# Patient Record
Sex: Male | Born: 1955 | Race: White | Hispanic: No | Marital: Married | Smoking: Former smoker
Health system: Southern US, Community
[De-identification: ages and names within clinical notes are randomized; demographics above are authoritative.]

## PROBLEM LIST (undated history)

## (undated) DIAGNOSIS — G629 Polyneuropathy, unspecified: Secondary | ICD-10-CM

## (undated) DIAGNOSIS — M545 Low back pain, unspecified: Secondary | ICD-10-CM

## (undated) DIAGNOSIS — Z83719 Family history of colon polyps, unspecified: Secondary | ICD-10-CM

## (undated) DIAGNOSIS — G709 Myoneural disorder, unspecified: Secondary | ICD-10-CM

## (undated) DIAGNOSIS — I10 Essential (primary) hypertension: Secondary | ICD-10-CM

## (undated) DIAGNOSIS — Z860100 Personal history of colon polyps, unspecified: Secondary | ICD-10-CM

## (undated) DIAGNOSIS — E785 Hyperlipidemia, unspecified: Secondary | ICD-10-CM

## (undated) DIAGNOSIS — K219 Gastro-esophageal reflux disease without esophagitis: Secondary | ICD-10-CM

## (undated) DIAGNOSIS — J189 Pneumonia, unspecified organism: Secondary | ICD-10-CM

## (undated) DIAGNOSIS — M51369 Other intervertebral disc degeneration, lumbar region without mention of lumbar back pain or lower extremity pain: Secondary | ICD-10-CM

## (undated) DIAGNOSIS — Z8371 Family history of colonic polyps: Secondary | ICD-10-CM

## (undated) DIAGNOSIS — D649 Anemia, unspecified: Secondary | ICD-10-CM

## (undated) DIAGNOSIS — M5090 Cervical disc disorder, unspecified, unspecified cervical region: Secondary | ICD-10-CM

## (undated) DIAGNOSIS — M5136 Other intervertebral disc degeneration, lumbar region: Secondary | ICD-10-CM

## (undated) DIAGNOSIS — R011 Cardiac murmur, unspecified: Secondary | ICD-10-CM

## (undated) DIAGNOSIS — I639 Cerebral infarction, unspecified: Secondary | ICD-10-CM

## (undated) HISTORY — PX: HEMORRHOID SURGERY: SHX153

## (undated) HISTORY — PX: EYE SURGERY: SHX253

## (undated) HISTORY — PX: ANTERIOR CERVICAL DECOMP/DISCECTOMY FUSION: SHX1161

## (undated) HISTORY — PX: WISDOM TOOTH EXTRACTION: SHX21

## (undated) HISTORY — PX: KNEE ARTHROSCOPY W/ ACL RECONSTRUCTION: SHX1858

## (undated) HISTORY — PX: TONSILLECTOMY: SUR1361

## (undated) HISTORY — PX: OTHER SURGICAL HISTORY: SHX169

## (undated) HISTORY — PX: COLONOSCOPY W/ POLYPECTOMY: SHX1380

## (undated) HISTORY — PX: BACK SURGERY: SHX140

---
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
## 1998-02-25 ENCOUNTER — Ambulatory Visit (HOSPITAL_COMMUNITY): Admission: RE | Admit: 1998-02-25 | Discharge: 1998-02-25 | Payer: Self-pay | Admitting: Neurosurgery

## 1998-03-22 ENCOUNTER — Ambulatory Visit (HOSPITAL_COMMUNITY): Admission: RE | Admit: 1998-03-22 | Discharge: 1998-03-22 | Payer: Self-pay | Admitting: Neurosurgery

## 1999-03-20 ENCOUNTER — Ambulatory Visit (HOSPITAL_COMMUNITY): Admission: RE | Admit: 1999-03-20 | Discharge: 1999-03-20 | Payer: Self-pay | Admitting: Orthopaedic Surgery

## 1999-03-20 ENCOUNTER — Encounter: Payer: Self-pay | Admitting: Orthopaedic Surgery

## 1999-04-12 ENCOUNTER — Other Ambulatory Visit: Admission: RE | Admit: 1999-04-12 | Discharge: 1999-04-12 | Payer: Self-pay | Admitting: Orthopaedic Surgery

## 2000-06-02 ENCOUNTER — Ambulatory Visit (HOSPITAL_COMMUNITY): Admission: RE | Admit: 2000-06-02 | Discharge: 2000-06-02 | Payer: Self-pay | Admitting: Neurosurgery

## 2000-06-02 ENCOUNTER — Encounter: Payer: Self-pay | Admitting: Neurosurgery

## 2000-06-12 ENCOUNTER — Ambulatory Visit (HOSPITAL_COMMUNITY): Admission: RE | Admit: 2000-06-12 | Discharge: 2000-06-12 | Payer: Self-pay | Admitting: Neurosurgery

## 2000-06-12 ENCOUNTER — Encounter: Payer: Self-pay | Admitting: Neurosurgery

## 2001-07-16 ENCOUNTER — Ambulatory Visit (HOSPITAL_COMMUNITY): Admission: RE | Admit: 2001-07-16 | Discharge: 2001-07-16 | Payer: Self-pay | Admitting: Neurosurgery

## 2001-07-16 ENCOUNTER — Encounter: Payer: Self-pay | Admitting: Neurosurgery

## 2001-08-24 ENCOUNTER — Encounter: Admission: RE | Admit: 2001-08-24 | Discharge: 2001-08-24 | Payer: Self-pay | Admitting: Neurosurgery

## 2001-08-24 ENCOUNTER — Encounter: Payer: Self-pay | Admitting: Neurosurgery

## 2001-09-07 ENCOUNTER — Encounter: Admission: RE | Admit: 2001-09-07 | Discharge: 2001-09-07 | Payer: Self-pay | Admitting: Neurosurgery

## 2001-09-07 ENCOUNTER — Encounter: Payer: Self-pay | Admitting: Neurosurgery

## 2001-09-21 ENCOUNTER — Encounter: Payer: Self-pay | Admitting: Neurosurgery

## 2001-09-21 ENCOUNTER — Encounter: Admission: RE | Admit: 2001-09-21 | Discharge: 2001-09-21 | Payer: Self-pay | Admitting: Neurosurgery

## 2002-07-25 ENCOUNTER — Ambulatory Visit (HOSPITAL_COMMUNITY): Admission: RE | Admit: 2002-07-25 | Discharge: 2002-07-25 | Payer: Self-pay | Admitting: Orthopaedic Surgery

## 2002-07-25 ENCOUNTER — Encounter: Payer: Self-pay | Admitting: Orthopaedic Surgery

## 2002-11-11 HISTORY — PX: EYE SURGERY: SHX253

## 2003-08-14 ENCOUNTER — Encounter: Payer: Self-pay | Admitting: Orthopaedic Surgery

## 2003-08-14 ENCOUNTER — Ambulatory Visit (HOSPITAL_COMMUNITY): Admission: RE | Admit: 2003-08-14 | Discharge: 2003-08-14 | Payer: Self-pay | Admitting: Orthopaedic Surgery

## 2004-11-08 ENCOUNTER — Ambulatory Visit (HOSPITAL_COMMUNITY): Admission: RE | Admit: 2004-11-08 | Discharge: 2004-11-08 | Payer: Self-pay | Admitting: Neurosurgery

## 2005-07-10 ENCOUNTER — Encounter: Admission: RE | Admit: 2005-07-10 | Discharge: 2005-07-10 | Payer: Self-pay | Admitting: Neurosurgery

## 2007-02-19 ENCOUNTER — Ambulatory Visit (HOSPITAL_COMMUNITY): Admission: RE | Admit: 2007-02-19 | Discharge: 2007-02-19 | Payer: Self-pay | Admitting: Neurosurgery

## 2007-10-01 ENCOUNTER — Ambulatory Visit: Payer: Self-pay | Admitting: Family Medicine

## 2008-04-10 IMAGING — CT CT PARANASAL SINUSES W/O CM
1 of 2 series · 16 of 30 positions shown, 20 images · non-contrast
Comparison: none

REASON FOR EXAM: chronic sinusitis
COMMENTS:

[Series 2: sinus · axial · 0.29mm/px · z∈[+223,+303]mm · 16 of 33 slices shown, 20 images]
[im 2/33  brain]
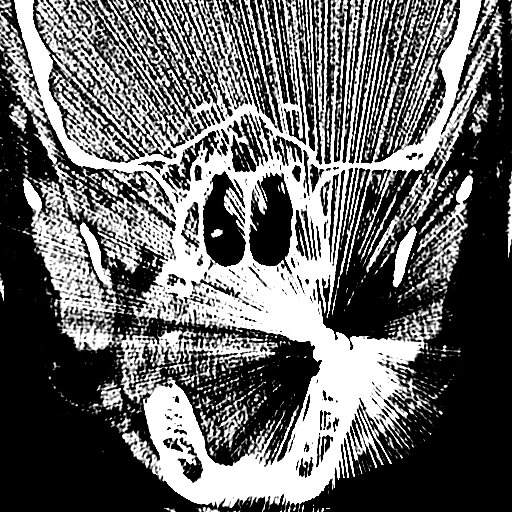
[im 2/33  bone]
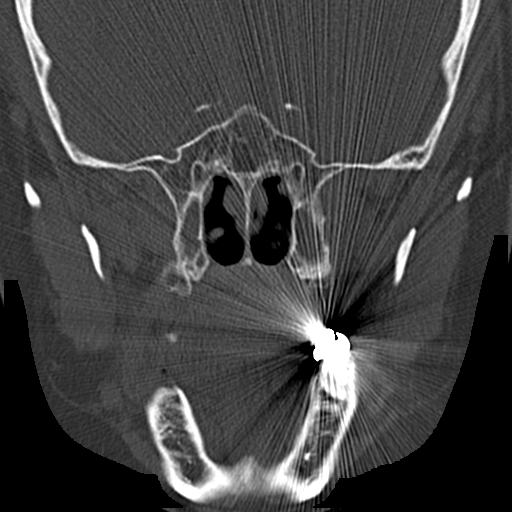
[im 5/33  bone]
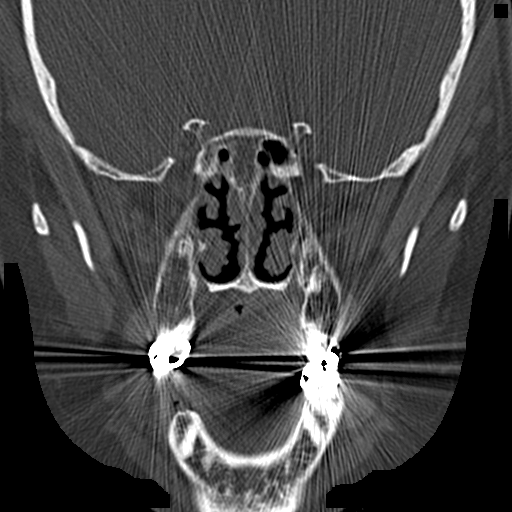
[im 6/33  bone]
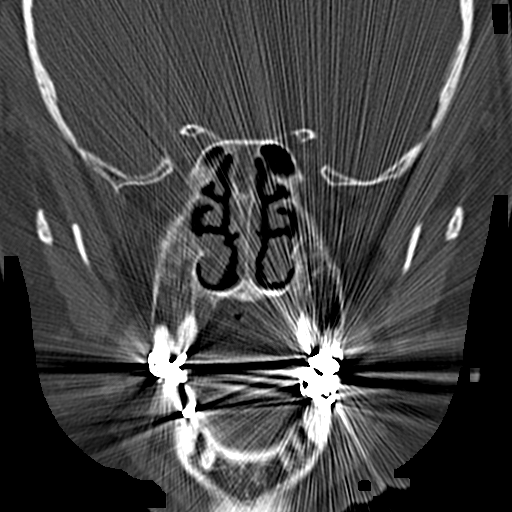
[im 9/33  bone]
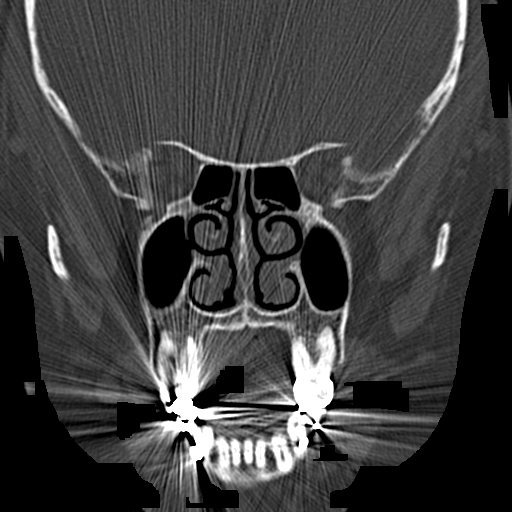
[im 10/33  brain]
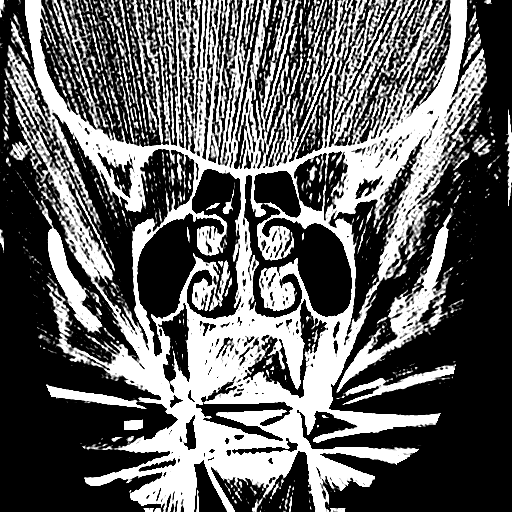
[im 10/33  bone]
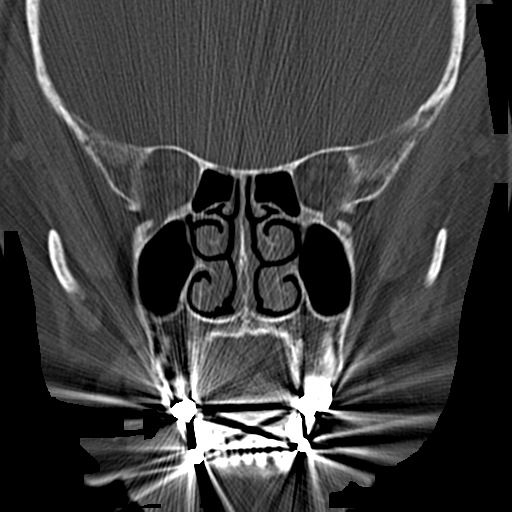
[im 11/33  bone]
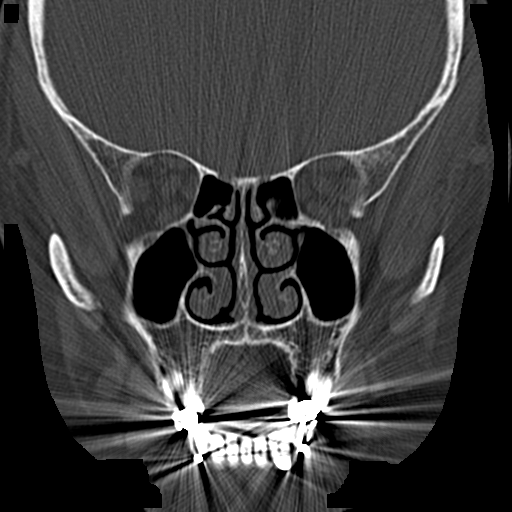
[im 14/33  bone]
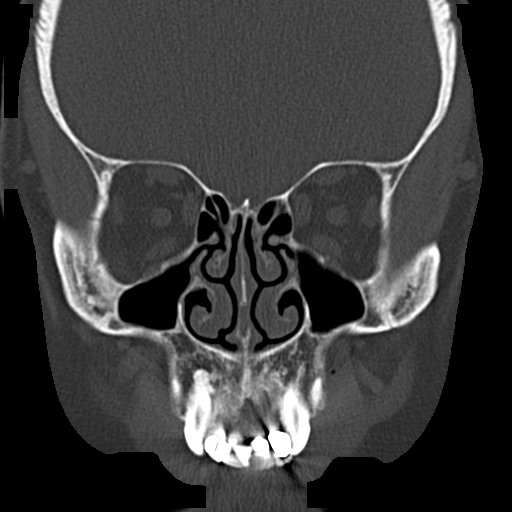
[im 15/33  bone]
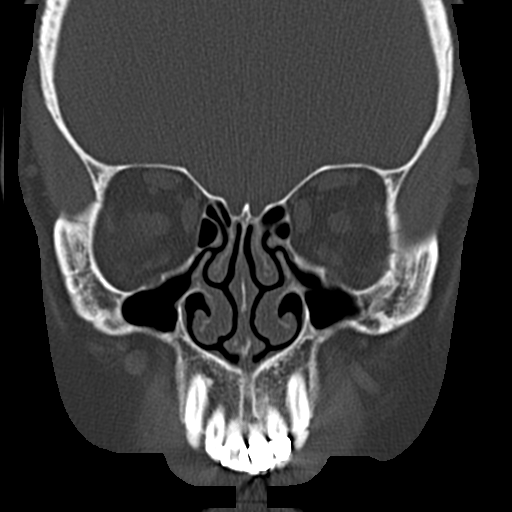
[im 18/33  brain]
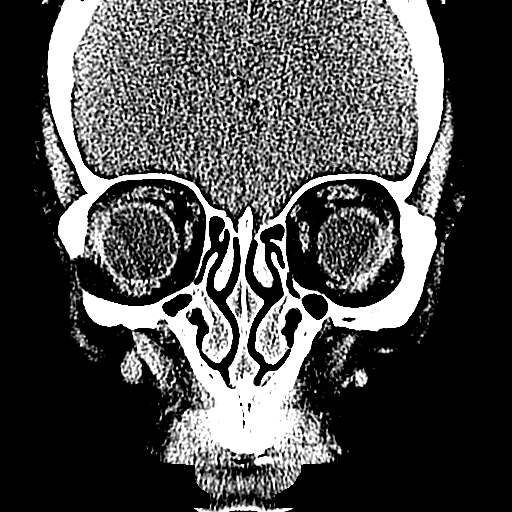
[im 18/33  bone]
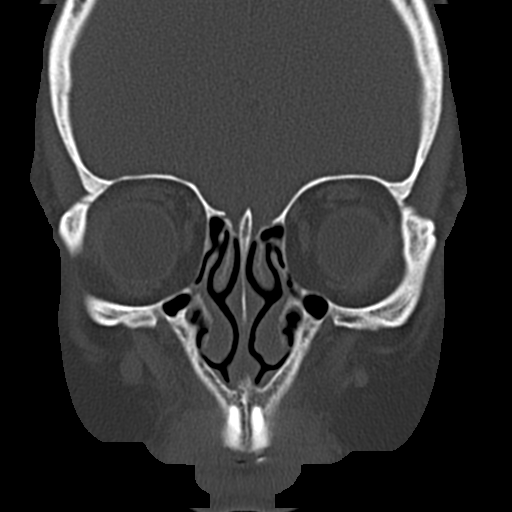
[im 19/33  bone]
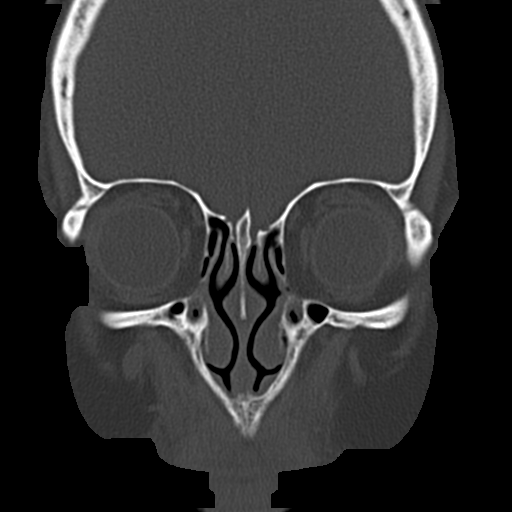
[im 22/33  bone]
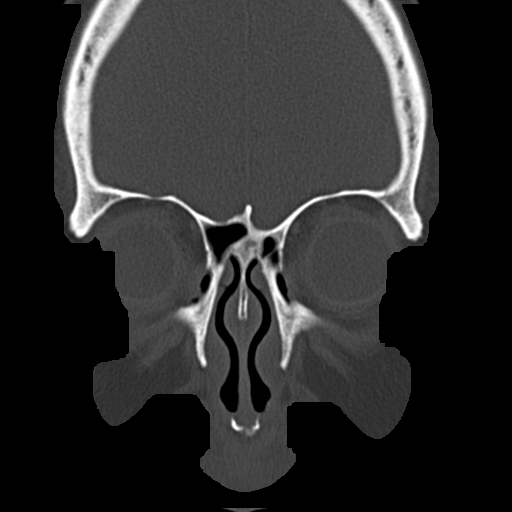
[im 23/33  bone]
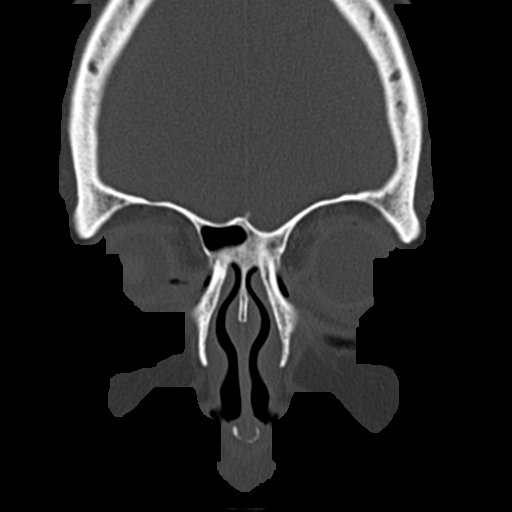
[im 25/33  brain]
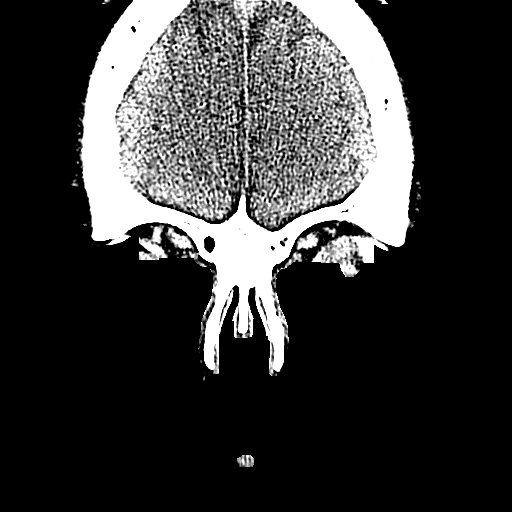
[im 25/33  bone]
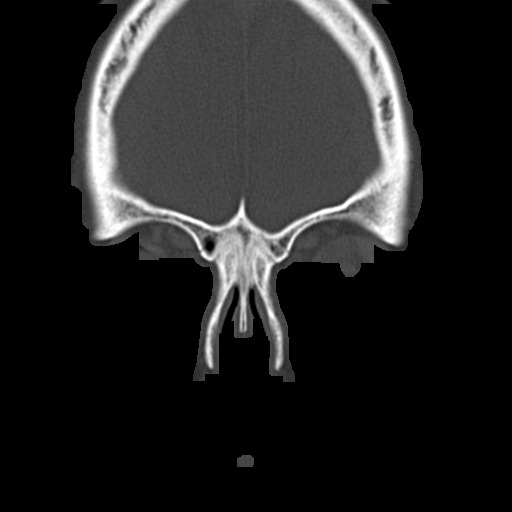
[im 27/33  bone]
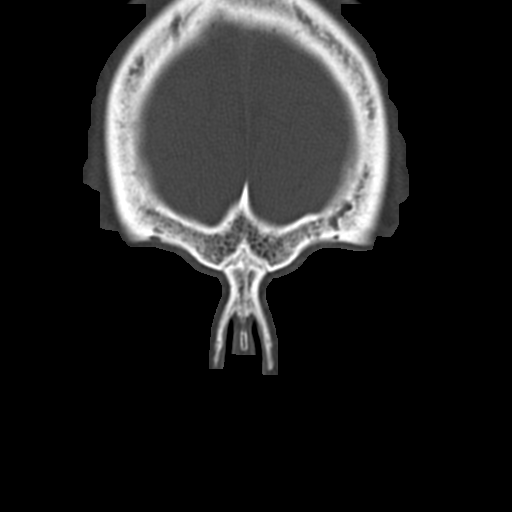
[im 29/33  bone]
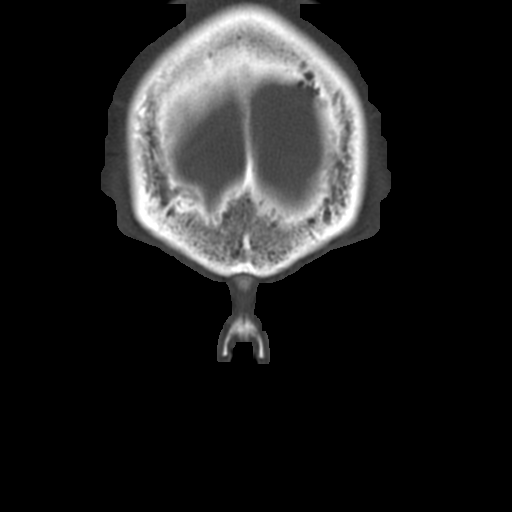
[im 31/33  bone]
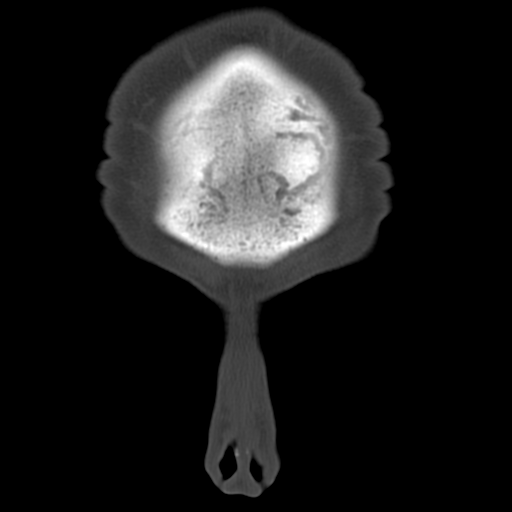

[16 of 30 positions shown; findings below may reference images not displayed]

PROCEDURE:     AALTJE - AALTJE SINUSES WITHOUT CONTRAST  - October 01, 2007  [DATE]

RESULT:     Direct coronal CT of the paranasal sinuses shows the frontal
sinuses are aplastic. The nasal septum approximates midline. The sinuses
otherwise appear to be normally aerated with no air-fluid level or mucosal
thickening evident. There is no bony destruction demonstrated.
IMPRESSION: No findings to suggest acute sinusitis. No definite bony
thickening or destruction to suggest chronic sinusitis. The ostiomeatal
complexes are patent and normally formed. The nasal septum approximates
midline.

## 2008-12-04 ENCOUNTER — Ambulatory Visit: Payer: Self-pay | Admitting: Family Medicine

## 2008-12-10 ENCOUNTER — Observation Stay: Payer: Self-pay | Admitting: Internal Medicine

## 2009-01-04 ENCOUNTER — Ambulatory Visit: Payer: Self-pay | Admitting: Neurology

## 2009-02-15 ENCOUNTER — Encounter: Admission: RE | Admit: 2009-02-15 | Discharge: 2009-02-15 | Payer: Self-pay | Admitting: Neurosurgery

## 2011-07-16 ENCOUNTER — Other Ambulatory Visit: Payer: Self-pay | Admitting: Neurosurgery

## 2011-07-16 DIAGNOSIS — M47817 Spondylosis without myelopathy or radiculopathy, lumbosacral region: Secondary | ICD-10-CM

## 2011-07-19 ENCOUNTER — Ambulatory Visit
Admission: RE | Admit: 2011-07-19 | Discharge: 2011-07-19 | Disposition: A | Discharge status: Home / Self Care | Payer: BC Managed Care – PPO | Source: Ambulatory Visit | Attending: Neurosurgery | Admitting: Neurosurgery

## 2011-07-19 DIAGNOSIS — M47817 Spondylosis without myelopathy or radiculopathy, lumbosacral region: Secondary | ICD-10-CM

## 2012-08-18 ENCOUNTER — Other Ambulatory Visit: Payer: Self-pay | Admitting: Neurosurgery

## 2012-08-18 DIAGNOSIS — G5711 Meralgia paresthetica, right lower limb: Secondary | ICD-10-CM

## 2013-02-25 ENCOUNTER — Other Ambulatory Visit: Payer: Self-pay | Admitting: Neurosurgery

## 2013-02-25 DIAGNOSIS — M47817 Spondylosis without myelopathy or radiculopathy, lumbosacral region: Secondary | ICD-10-CM

## 2013-03-04 ENCOUNTER — Ambulatory Visit
Admission: RE | Admit: 2013-03-04 | Discharge: 2013-03-04 | Disposition: A | Discharge status: Home / Self Care | Payer: BC Managed Care – PPO | Source: Ambulatory Visit | Attending: Neurosurgery | Admitting: Neurosurgery

## 2013-03-04 DIAGNOSIS — M47817 Spondylosis without myelopathy or radiculopathy, lumbosacral region: Secondary | ICD-10-CM

## 2013-07-09 ENCOUNTER — Ambulatory Visit: Payer: Self-pay

## 2016-04-29 ENCOUNTER — Ambulatory Visit
Admission: RE | Admit: 2016-04-29 | Discharge: 2016-04-29 | Disposition: A | Discharge status: Home / Self Care | Payer: Medicare HMO | Source: Ambulatory Visit | Attending: Unknown Physician Specialty | Admitting: Unknown Physician Specialty

## 2016-04-29 ENCOUNTER — Encounter
Admission: RE | Disposition: A | Discharge status: Home / Self Care | Payer: Self-pay | Source: Ambulatory Visit | Attending: Unknown Physician Specialty

## 2016-04-29 ENCOUNTER — Ambulatory Visit: Payer: Medicare HMO | Admitting: Anesthesiology

## 2016-04-29 ENCOUNTER — Encounter: Payer: Self-pay | Admitting: *Deleted

## 2016-04-29 DIAGNOSIS — M5090 Cervical disc disorder, unspecified, unspecified cervical region: Secondary | ICD-10-CM | POA: Diagnosis not present

## 2016-04-29 DIAGNOSIS — Z79899 Other long term (current) drug therapy: Secondary | ICD-10-CM | POA: Diagnosis not present

## 2016-04-29 DIAGNOSIS — D122 Benign neoplasm of ascending colon: Secondary | ICD-10-CM | POA: Insufficient documentation

## 2016-04-29 DIAGNOSIS — Z8371 Family history of colonic polyps: Secondary | ICD-10-CM | POA: Diagnosis not present

## 2016-04-29 DIAGNOSIS — D123 Benign neoplasm of transverse colon: Secondary | ICD-10-CM | POA: Insufficient documentation

## 2016-04-29 DIAGNOSIS — I10 Essential (primary) hypertension: Secondary | ICD-10-CM | POA: Diagnosis not present

## 2016-04-29 DIAGNOSIS — M5136 Other intervertebral disc degeneration, lumbar region: Secondary | ICD-10-CM | POA: Diagnosis not present

## 2016-04-29 DIAGNOSIS — D125 Benign neoplasm of sigmoid colon: Secondary | ICD-10-CM | POA: Insufficient documentation

## 2016-04-29 DIAGNOSIS — Z8673 Personal history of transient ischemic attack (TIA), and cerebral infarction without residual deficits: Secondary | ICD-10-CM | POA: Diagnosis not present

## 2016-04-29 DIAGNOSIS — K640 First degree hemorrhoids: Secondary | ICD-10-CM | POA: Insufficient documentation

## 2016-04-29 DIAGNOSIS — Z7982 Long term (current) use of aspirin: Secondary | ICD-10-CM | POA: Diagnosis not present

## 2016-04-29 DIAGNOSIS — Z1211 Encounter for screening for malignant neoplasm of colon: Secondary | ICD-10-CM | POA: Diagnosis present

## 2016-04-29 HISTORY — DX: Essential (primary) hypertension: I10

## 2016-04-29 HISTORY — DX: Other intervertebral disc degeneration, lumbar region without mention of lumbar back pain or lower extremity pain: M51.369

## 2016-04-29 HISTORY — DX: Other intervertebral disc degeneration, lumbar region: M51.36

## 2016-04-29 HISTORY — PX: COLONOSCOPY WITH PROPOFOL: SHX5780

## 2016-04-29 HISTORY — DX: Cervical disc disorder, unspecified, unspecified cervical region: M50.90

## 2016-04-29 SURGERY — COLONOSCOPY WITH PROPOFOL
Anesthesia: General

## 2016-04-29 MED ORDER — SODIUM CHLORIDE 0.9 % IV SOLN
INTRAVENOUS | Status: DC
  Administered 2016-04-29: 1000 mL via INTRAVENOUS

## 2016-04-29 MED ORDER — LIDOCAINE HCL (PF) 2 % IJ SOLN
INTRAMUSCULAR | Status: DC | PRN
  Administered 2016-04-29: 50 mg

## 2016-04-29 MED ORDER — MIDAZOLAM HCL 5 MG/5ML IJ SOLN
INTRAMUSCULAR | Status: DC | PRN
  Administered 2016-04-29 (×2): 1 mg via INTRAVENOUS

## 2016-04-29 MED ORDER — PROPOFOL 500 MG/50ML IV EMUL
INTRAVENOUS | Status: DC | PRN
  Administered 2016-04-29: 10 ug/kg/min via INTRAVENOUS

## 2016-04-29 MED ORDER — FENTANYL CITRATE (PF) 100 MCG/2ML IJ SOLN
INTRAMUSCULAR | Status: DC | PRN
  Administered 2016-04-29: 50 ug via INTRAVENOUS

## 2016-04-29 MED ORDER — PROPOFOL 10 MG/ML IV BOLUS
INTRAVENOUS | Status: DC | PRN
  Administered 2016-04-29: 50 mg via INTRAVENOUS

## 2016-04-29 MED ORDER — SODIUM CHLORIDE 0.9 % IV SOLN: INTRAVENOUS | Status: DC
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
## 2016-04-29 NOTE — Op Note (Addendum)
[facility] Gastroenterology Patient Name: Tony Bender Procedure Date: 04/29/2016 9:23 AM MRN: [id number] Account #: [id number] Date of Birth: 30-Nov-1955 Admit Type: Outpatient Age: 60 Room: [facility] ENDO ROOM 1 Gender: Male Note Status: Finalized Procedure:            Colonoscopy Indications:          Colon cancer screening in patient at increased risk:                        Family history of 1st-degree relative with colon polyps Providers:            Manya Silvas, MD Referring MD:         Sena Hitch, MD (Referring MD) Medicines:            Propofol per Anesthesia Complications:        No immediate complications. Procedure:            Pre-Anesthesia Assessment:                       - After reviewing the risks and benefits, the patient                        was deemed in satisfactory condition to undergo the                        procedure.                       After obtaining informed consent, the colonoscope was                        passed under direct vision. Throughout the procedure,                        the patient's blood pressure, pulse, and oxygen                        saturations were monitored continuously. The                        Colonoscope was introduced through the anus and                        advanced to the the cecum, identified by appendiceal                        orifice and ileocecal valve. The colonoscopy was                        performed without difficulty. The patient tolerated the                        procedure well. The quality of the bowel preparation                        was excellent. Findings:      Two sessile polyps were found in the ascending colon. The polyps were       diminutive in size. These polyps were removed with a jumbo cold forceps.       Resection and retrieval were complete. Bottle #2  Two sessile polyps were found in the ascending colon. The polyps were       small in size. These  polyps were removed with a cold snare. Resection       and retrieval were complete. Bottle 3      A small polyp was found in the proximal sigmoid colon. The polyp was       sessile. The polyp was removed with a hot snare. Resection and retrieval       were complete. bottle #4      Internal hemorrhoids were found during endoscopy. The hemorrhoids were       medium-sized and Grade I (internal hemorrhoids that do not prolapse).      A diminutive polyp was found in the hepatic flexure. The polyp was       sessile. The polyp was removed with a jumbo cold forceps. Resection and       retrieval were complete. Label bottle # 1 Impression:           - Two diminutive polyps in the ascending colon, removed                        with a jumbo cold forceps. Resected and retrieved.                       - Two small polyps in the ascending colon, removed with                        a cold snare. Resected and retrieved.                       - One small polyp in the proximal sigmoid colon,                        removed with a hot snare. Resected and retrieved.                       - Internal hemorrhoids.                       - One diminutive polyp at the hepatic flexure, removed                        with a jumbo cold forceps. Resected and retrieved. Recommendation:       - Await pathology results. Manya Silvas, MD 04/29/2016 10:12:14 AM This report has been signed electronically. Number of Addenda: 0 Note Initiated On: 04/29/2016 9:23 AM Scope Withdrawal Time: 0 hours 11 minutes 21 seconds  Total Procedure Duration: 0 hours 25 minutes 0 seconds       [facility]

## 2016-04-29 NOTE — Anesthesia Postprocedure Evaluation (Signed)
Anesthesia Post Note  Patient: Tony Bender  Procedure(s) Performed: Procedure(s) (LRB): COLONOSCOPY WITH PROPOFOL (N/A)  Patient location during evaluation: Endoscopy Anesthesia Type: General Level of consciousness: awake and alert Pain management: pain level controlled Vital Signs Assessment: post-procedure vital signs reviewed and stable Respiratory status: spontaneous breathing, nonlabored ventilation, respiratory function stable and patient connected to nasal cannula oxygen Cardiovascular status: blood pressure returned to baseline and stable Postop Assessment: no signs of nausea or vomiting Anesthetic complications: no    Last Vitals:  Filed Vitals:   04/29/16 1030 04/29/16 1040  BP: 111/61 113/65  Pulse: 64 62  Temp:    Resp: 13 16    Last Pain:  Filed Vitals:   04/29/16 1048  PainSc: 3                  Areona Homer S

## 2016-04-29 NOTE — H&P (Signed)
   Primary Care Physician:  No primary care provider on file. Primary Gastroenterologist:  Dr. Vira Agar  Pre-Procedure History & Physical: HPI:  Tony Bender is a 60 y.o. male is here for an colonoscopy.   Past Medical History  Diagnosis Date  . Hypertension   . Disc degeneration, lumbar   . Disc disorder of cervical region     Past Surgical History  Procedure Laterality Date  . Knee arthroscopy w/ acl reconstruction      Prior to Admission medications   Medication Sig Start Date End Date Taking? Authorizing Provider  aspirin EC 81 MG tablet Take 81 mg by mouth daily.   Yes Historical Provider, MD  Cholecalciferol (VITAMIN D3) 3000 units TABS Take by mouth.   Yes Historical Provider, MD  cyclobenzaprine (FLEXERIL) 10 MG tablet Take 10 mg by mouth 3 (three) times daily as needed for muscle spasms.   Yes Historical Provider, MD  HYDROcodone-acetaminophen (NORCO) 10-325 MG tablet Take 1 tablet by mouth every 6 (six) hours as needed.   Yes Historical Provider, MD  lisinopril-hydrochlorothiazide (PRINZIDE,ZESTORETIC) 20-12.5 MG tablet Take 1 tablet by mouth daily.   Yes Historical Provider, MD  Omega-3 Fatty Acids (FISH OIL OMEGA-3 PO) Take by mouth.   Yes Historical Provider, MD  rosuvastatin (CRESTOR) 5 MG tablet Take 5 mg by mouth daily.   Yes Historical Provider, MD    Allergies as of 04/11/2016  . (Not on File)    No family history on file.  Social History   Social History  . Marital Status: Married    Spouse Name: N/A  . Number of Children: N/A  . Years of Education: N/A   Occupational History  . Not on file.   Social History Main Topics  . Smoking status: Not on file  . Smokeless tobacco: Not on file  . Alcohol Use: Not on file  . Drug Use: Not on file  . Sexual Activity: Not on file   Other Topics Concern  . Not on file   Social History Narrative  . No narrative on file    Review of Systems: See HPI, otherwise negative ROS  Physical Exam: BP  149/90 mmHg  Pulse 75  Temp(Src) 96.8 F (36 C) (Tympanic)  Resp 16  Ht 6\' 3"  (1.905 m)  Wt 106.595 kg (235 lb)  BMI 29.37 kg/m2  SpO2 100% General:   Alert,  pleasant and cooperative in NAD Head:  Normocephalic and atraumatic. Neck:  Supple; no masses or thyromegaly. Lungs:  Clear throughout to auscultation.    Heart:  Regular rate and rhythm. Abdomen:  Soft, nontender and nondistended. Normal bowel sounds, without guarding, and without rebound.   Neurologic:  Alert and  oriented x4;  grossly normal neurologically.  Impression/Plan: Tony Bender is here for an colonoscopy to be performed for screening with FH colon polyps  Risks, benefits, limitations, and alternatives regarding  colonoscopy have been reviewed with the patient.  Questions have been answered.  All parties agreeable.   Gaylyn Cheers, MD  04/29/2016, 9:34 AM

## 2016-04-29 NOTE — Transfer of Care (Signed)
Immediate Anesthesia Transfer of Care Note  Patient: Tony Bender  Procedure(s) Performed: Procedure(s): COLONOSCOPY WITH PROPOFOL (N/A)  Patient Location: PACU  Anesthesia Type:General  Level of Consciousness: sedated  Airway & Oxygen Therapy: Patient Spontanous Breathing and Patient connected to nasal cannula oxygen  Post-op Assessment: Report given to RN and Post -op Vital signs reviewed and stable  Post vital signs: Reviewed and stable  Last Vitals:  Filed Vitals:   04/29/16 0851 04/29/16 1014  BP: 149/90 96/61  Pulse: 75 72  Temp: 36 C 35.6 C  Resp: 16 12    Last Pain:  Filed Vitals:   04/29/16 1014  PainSc: 3          Complications: No apparent anesthesia complications

## 2016-04-30 ENCOUNTER — Encounter: Payer: Self-pay | Admitting: Unknown Physician Specialty

## 2016-04-30 LAB — SURGICAL PATHOLOGY

## 2016-06-06 ENCOUNTER — Other Ambulatory Visit: Payer: Self-pay | Admitting: Neurosurgery

## 2016-06-06 DIAGNOSIS — M47812 Spondylosis without myelopathy or radiculopathy, cervical region: Secondary | ICD-10-CM

## 2016-06-19 ENCOUNTER — Ambulatory Visit
Admission: RE | Admit: 2016-06-19 | Discharge: 2016-06-19 | Disposition: A | Discharge status: Home / Self Care | Payer: Medicare HMO | Source: Ambulatory Visit | Attending: Neurosurgery | Admitting: Neurosurgery

## 2016-06-19 DIAGNOSIS — M47812 Spondylosis without myelopathy or radiculopathy, cervical region: Secondary | ICD-10-CM

## 2016-07-13 ENCOUNTER — Encounter: Payer: Self-pay | Admitting: Gynecology

## 2016-07-13 ENCOUNTER — Ambulatory Visit (INDEPENDENT_AMBULATORY_CARE_PROVIDER_SITE_OTHER): Payer: Medicare HMO

## 2016-07-13 ENCOUNTER — Ambulatory Visit
Admission: EM | Admit: 2016-07-13 | Discharge: 2016-07-13 | Disposition: A | Discharge status: Home / Self Care | Payer: Medicare HMO | Attending: Family Medicine | Admitting: Family Medicine

## 2016-07-13 DIAGNOSIS — S29009A Unspecified injury of muscle and tendon of unspecified wall of thorax, initial encounter: Secondary | ICD-10-CM | POA: Diagnosis not present

## 2016-07-13 DIAGNOSIS — S161XXA Strain of muscle, fascia and tendon at neck level, initial encounter: Secondary | ICD-10-CM

## 2016-07-13 DIAGNOSIS — S29019A Strain of muscle and tendon of unspecified wall of thorax, initial encounter: Secondary | ICD-10-CM
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
## 2018-06-18 ENCOUNTER — Other Ambulatory Visit: Payer: Self-pay | Admitting: Neurosurgery

## 2018-06-18 DIAGNOSIS — M47816 Spondylosis without myelopathy or radiculopathy, lumbar region: Secondary | ICD-10-CM

## 2018-06-21 ENCOUNTER — Ambulatory Visit
Admission: RE | Admit: 2018-06-21 | Discharge: 2018-06-21 | Disposition: A | Discharge status: Home / Self Care | Payer: Medicare HMO | Source: Ambulatory Visit | Attending: Neurosurgery | Admitting: Neurosurgery

## 2018-06-21 DIAGNOSIS — M47816 Spondylosis without myelopathy or radiculopathy, lumbar region: Secondary | ICD-10-CM

## 2019-09-01 ENCOUNTER — Telehealth: Payer: Self-pay | Admitting: Gastroenterology

## 2019-09-01 NOTE — Telephone Encounter (Signed)
Pt is calling for Michelle to schedule a colonoscopy °

## 2019-09-01 NOTE — Telephone Encounter (Signed)
Patients referral has been sent to our office in error.  Patients last colonoscopy was 2017 at [facility] history of colon polyps.  He was seen by Tammi Klippel at [facility] in May and they planned to schedule his repeat colonoscopy-but he never heard back from them.  He said he mentioned this to Dr. Shade Flood and guess Dr. Shade Flood sent [place] the referral.  He has been provided with [facility] phone number to contact them when he is ready to schedule.  Thanks [employer]

## 2019-12-23 ENCOUNTER — Ambulatory Visit: Admit: 2019-12-23 | Payer: Medicare HMO | Admitting: Internal Medicine

## 2019-12-23 SURGERY — COLONOSCOPY WITH PROPOFOL
Anesthesia: General

## 2020-01-13 ENCOUNTER — Other Ambulatory Visit
Admission: RE | Admit: 2020-01-13 | Discharge: 2020-01-13 | Disposition: A | Discharge status: Home / Self Care | Payer: Medicare HMO | Source: Ambulatory Visit | Attending: Internal Medicine | Admitting: Internal Medicine

## 2020-01-13 DIAGNOSIS — Z20822 Contact with and (suspected) exposure to covid-19: Secondary | ICD-10-CM | POA: Insufficient documentation

## 2020-01-13 DIAGNOSIS — Z01812 Encounter for preprocedural laboratory examination: Secondary | ICD-10-CM | POA: Diagnosis present

## 2020-01-13 LAB — SARS CORONAVIRUS 2 (TAT 6-24 HRS): SARS Coronavirus 2: NEGATIVE

## 2020-01-14 ENCOUNTER — Encounter: Payer: Self-pay | Admitting: Internal Medicine

## 2020-01-17 ENCOUNTER — Ambulatory Visit
Admission: RE | Admit: 2020-01-17 | Discharge: 2020-01-17 | Disposition: A | Discharge status: Home / Self Care | Payer: Medicare HMO | Attending: Internal Medicine | Admitting: Internal Medicine

## 2020-01-17 ENCOUNTER — Ambulatory Visit: Payer: Medicare HMO | Admitting: Anesthesiology

## 2020-01-17 ENCOUNTER — Encounter
Admission: RE | Disposition: A | Discharge status: Home / Self Care | Payer: Self-pay | Source: Home / Self Care | Attending: Internal Medicine

## 2020-01-17 ENCOUNTER — Other Ambulatory Visit: Payer: Self-pay

## 2020-01-17 ENCOUNTER — Encounter: Payer: Self-pay | Admitting: Internal Medicine

## 2020-01-17 DIAGNOSIS — M1990 Unspecified osteoarthritis, unspecified site: Secondary | ICD-10-CM | POA: Diagnosis not present

## 2020-01-17 DIAGNOSIS — Z79899 Other long term (current) drug therapy: Secondary | ICD-10-CM | POA: Insufficient documentation

## 2020-01-17 DIAGNOSIS — Z888 Allergy status to other drugs, medicaments and biological substances status: Secondary | ICD-10-CM | POA: Diagnosis not present

## 2020-01-17 DIAGNOSIS — Z86010 Personal history of colonic polyps: Secondary | ICD-10-CM | POA: Insufficient documentation

## 2020-01-17 DIAGNOSIS — Z7982 Long term (current) use of aspirin: Secondary | ICD-10-CM | POA: Insufficient documentation

## 2020-01-17 DIAGNOSIS — K640 First degree hemorrhoids: Secondary | ICD-10-CM | POA: Diagnosis not present

## 2020-01-17 DIAGNOSIS — K219 Gastro-esophageal reflux disease without esophagitis: Secondary | ICD-10-CM | POA: Insufficient documentation

## 2020-01-17 DIAGNOSIS — Z1211 Encounter for screening for malignant neoplasm of colon: Secondary | ICD-10-CM | POA: Insufficient documentation

## 2020-01-17 DIAGNOSIS — Z880 Allergy status to penicillin: Secondary | ICD-10-CM | POA: Diagnosis not present

## 2020-01-17 DIAGNOSIS — Z8673 Personal history of transient ischemic attack (TIA), and cerebral infarction without residual deficits: Secondary | ICD-10-CM | POA: Insufficient documentation

## 2020-01-17 DIAGNOSIS — Z87891 Personal history of nicotine dependence: Secondary | ICD-10-CM | POA: Insufficient documentation

## 2020-01-17 DIAGNOSIS — E785 Hyperlipidemia, unspecified: Secondary | ICD-10-CM | POA: Diagnosis not present

## 2020-01-17 DIAGNOSIS — I10 Essential (primary) hypertension: Secondary | ICD-10-CM | POA: Diagnosis not present

## 2020-01-17 HISTORY — PX: COLONOSCOPY WITH PROPOFOL: SHX5780

## 2020-01-17 HISTORY — DX: Personal history of colon polyps, unspecified: Z86.0100

## 2020-01-17 HISTORY — DX: Hyperlipidemia, unspecified: E78.5

## 2020-01-17 HISTORY — DX: Family history of colonic polyps: Z83.71

## 2020-01-17 HISTORY — DX: Cerebral infarction, unspecified: I63.9

## 2020-01-17 HISTORY — DX: Cardiac murmur, unspecified: R01.1

## 2020-01-17 HISTORY — DX: Family history of colon polyps, unspecified: Z83.719

## 2020-01-17 HISTORY — DX: Gastro-esophageal reflux disease without esophagitis: K21.9

## 2020-01-17 HISTORY — DX: Personal history of colonic polyps: Z86.010

## 2020-01-17 HISTORY — DX: Low back pain, unspecified: M54.50

## 2020-01-17 SURGERY — COLONOSCOPY WITH PROPOFOL
Anesthesia: General

## 2020-01-17 MED ORDER — LIDOCAINE HCL (CARDIAC) PF 100 MG/5ML IV SOSY
PREFILLED_SYRINGE | INTRAVENOUS | Status: DC | PRN
  Administered 2020-01-17: 25 mg via INTRAVENOUS

## 2020-01-17 MED ORDER — PROPOFOL 10 MG/ML IV BOLUS
INTRAVENOUS | Status: DC | PRN
  Administered 2020-01-17: 70 mg via INTRAVENOUS

## 2020-01-17 MED ORDER — SODIUM CHLORIDE 0.9 % IV SOLN: INTRAVENOUS | Status: DC

## 2020-01-17 MED ORDER — LIDOCAINE HCL (PF) 2 % IJ SOLN
INTRAMUSCULAR | Status: AC
  Filled 2020-01-17: qty 10

## 2020-01-17 MED ORDER — PROPOFOL 500 MG/50ML IV EMUL
INTRAVENOUS | Status: AC
  Filled 2020-01-17: qty 50

## 2020-01-17 MED ORDER — PROPOFOL 500 MG/50ML IV EMUL
INTRAVENOUS | Status: DC | PRN
  Administered 2020-01-17: 125 ug/kg/min via INTRAVENOUS
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
## 2020-01-17 NOTE — Op Note (Signed)
[facility] Gastroenterology Patient Name: Tony Bender Procedure Date: 01/17/2020 9:41 AM MRN: [id number] Account #: [id number] Date of Birth: 03-30-1956 Admit Type: Outpatient Age: 64 Room: [facility] ENDO ROOM 3 Gender: Male Note Status: Finalized Procedure:             Colonoscopy Indications:           High risk colon cancer surveillance: Personal history                         of non-advanced adenoma Providers:             Benay Pike. Loura Pitt MD, MD Medicines:             Propofol per Anesthesia Complications:         No immediate complications. Procedure:             Pre-Anesthesia Assessment:                        - The risks and benefits of the procedure and the                         sedation options and risks were discussed with the                         patient. All questions were answered and informed                         consent was obtained.                        - Patient identification and proposed procedure were                         verified prior to the procedure. The procedure was                         verified in the procedure room.                        - ASA Grade Assessment: III - A patient with severe                         systemic disease.                        - After reviewing the risks and benefits, the patient                         was deemed in satisfactory condition to undergo the                         procedure.                        After obtaining informed consent, the colonoscope was                         passed under direct vision. Throughout the procedure,  the patient's blood pressure, pulse, and oxygen                         saturations were monitored continuously. The                         Colonoscope was introduced through the anus and                         advanced to the the cecum, identified by appendiceal                         orifice and ileocecal valve. The colonoscopy  was                         performed without difficulty. The patient tolerated                         the procedure well. The quality of the bowel                         preparation was adequate. The ileocecal valve,                         appendiceal orifice, and rectum were photographed. Findings:      The perianal and digital rectal examinations were normal. Pertinent       negatives include normal sphincter tone and no palpable rectal lesions.      The colon (entire examined portion) appeared normal. Estimated blood       loss: none.      Non-bleeding internal hemorrhoids were found during retroflexion. The       hemorrhoids were Grade I (internal hemorrhoids that do not prolapse).      The exam was otherwise without abnormality. Impression:            - The entire examined colon is normal.                        - Non-bleeding internal hemorrhoids.                        - The examination was otherwise normal.                        - No specimens collected. Recommendation:        - Patient has a contact number available for                         emergencies. The signs and symptoms of potential                         delayed complications were discussed with the patient.                         Return to normal activities tomorrow. Written                         discharge instructions were provided to the patient.                        -  Resume previous diet.                        - Continue present medications.                        - Repeat colonoscopy in 5 years for surveillance.                        - Return to GI office PRN.                        - The findings and recommendations were discussed with                         the patient. Procedure Code(s):     --- Professional ---                        [id number], Colorectal cancer screening; colonoscopy on                         individual at high risk Diagnosis Code(s):     --- Professional ---                         K64.0, First degree hemorrhoids                        Z86.010, Personal history of colonic polyps CPT copyright 2019 [employer]. All rights reserved. The codes documented in this report are preliminary and upon coder review may  be revised to meet current compliance requirements. Efrain Sella MD, MD 01/17/2020 10:09:55 AM This report has been signed electronically. Number of Addenda: 0 Note Initiated On: 01/17/2020 9:41 AM Scope Withdrawal Time: 0 hours 6 minutes 12 seconds  Total Procedure Duration: 0 hours 10 minutes 38 seconds  Estimated Blood Loss:  Estimated blood loss: none.      [facility]

## 2020-01-17 NOTE — Anesthesia Postprocedure Evaluation (Signed)
Anesthesia Post Note  Patient: Tony Bender  Procedure(s) Performed: COLONOSCOPY WITH PROPOFOL (N/A )  Patient location during evaluation: Endoscopy Anesthesia Type: General Level of consciousness: awake and alert and oriented Pain management: pain level controlled Vital Signs Assessment: post-procedure vital signs reviewed and stable Respiratory status: spontaneous breathing Cardiovascular status: blood pressure returned to baseline Anesthetic complications: no     Last Vitals:  Vitals:   01/17/20 1021 01/17/20 1031  BP: 113/71 127/76  Pulse: 67 75  Resp: 14 14  Temp:    SpO2: 100% 100%    Last Pain:  Vitals:   01/17/20 1031  TempSrc:   PainSc: 0-No pain                 Sia Gabrielsen

## 2020-01-17 NOTE — Interval H&P Note (Signed)
History and Physical Interval Note:  01/17/2020 9:46 AM  Tony Bender  has presented today for surgery, with the diagnosis of HX ADEN POLYPS FM HX POLYPS.  The various methods of treatment have been discussed with the patient and family. After consideration of risks, benefits and other options for treatment, the patient has consented to  Procedure(s): COLONOSCOPY WITH PROPOFOL (N/A) as a surgical intervention.  The patient's history has been reviewed, patient examined, no change in status, stable for surgery.  I have reviewed the patient's chart and labs.  Questions were answered to the patient's satisfaction.     [place], [place]

## 2020-01-17 NOTE — Transfer of Care (Signed)
Immediate Anesthesia Transfer of Care Note  Patient: Tony Bender  Procedure(s) Performed: COLONOSCOPY WITH PROPOFOL (N/A )  Patient Location: PACU and Endoscopy Unit  Anesthesia Type:General  Level of Consciousness: awake  Airway & Oxygen Therapy: Patient connected to nasal cannula oxygen  Post-op Assessment: Report given to RN  Post vital signs: stable  Last Vitals:  Vitals Value Taken Time  BP 104/73 01/17/20 1011  Temp    Pulse 83 01/17/20 1012  Resp 15 01/17/20 1012  SpO2 100 % 01/17/20 1012  Vitals shown include unvalidated device data.  Last Pain:  Vitals:   01/17/20 0919  TempSrc: Temporal  PainSc: 0-No pain         Complications: No apparent anesthesia complications

## 2020-01-17 NOTE — H&P (Signed)
Outpatient short stay form Pre-procedure 01/17/2020 9:43 AM Tony Franca K. Alice Reichert, M.D.  Primary Physician: Sena Hitch, M.D.  Reason for visit:  Personal hx of multiple colon adenomas, June 2017 colonoscopy.  History of present illness:                            Patient presents for colonoscopy for a personal hx of colon polyps. The patient denies abdominal pain, abnormal weight loss or rectal bleeding.      Current Facility-Administered Medications:  .  0.9 %  sodium chloride infusion, , Intravenous, Continuous, [place], Benay Pike, MD, Last Rate: 20 mL/hr at 01/17/20 [id number], New Bag at 01/17/20 [id number]  Medications Prior to Admission  Medication Sig Dispense Refill Last Dose  . aspirin EC 81 MG tablet Take 81 mg by mouth daily.   01/16/2020 at Unknown time  . cyclobenzaprine (FLEXERIL) 10 MG tablet Take 10 mg by mouth 3 (three) times daily as needed for muscle spasms.   01/14/2020 at Unknown time  . famotidine (PEPCID) 20 MG tablet Take 20 mg by mouth 2 (two) times daily.   01/16/2020 at Unknown time  . lisinopril-hydrochlorothiazide (PRINZIDE,ZESTORETIC) 20-12.5 MG tablet Take 1 tablet by mouth daily.   01/17/2020 at Unknown time  . omeprazole (PRILOSEC) 20 MG capsule Take 20 mg by mouth daily.     . Cholecalciferol (VITAMIN D3) 3000 units TABS Take by mouth.     Marland Kitchen HYDROcodone-acetaminophen (NORCO) 10-325 MG tablet Take 1 tablet by mouth every 6 (six) hours as needed.     . Omega-3 Fatty Acids (FISH OIL OMEGA-3 PO) Take by mouth.     . rosuvastatin (CRESTOR) 5 MG tablet Take 5 mg by mouth daily.   01/16/2020     Allergies  Allergen Reactions  . Celecoxib Other (See Comments)  . Lipitor [Atorvastatin]   . Meloxicam Nausea Only  . Penicillins      Past Medical History:  Diagnosis Date  . Disc degeneration, lumbar   . Disc disorder of cervical region   . Family history of colonic polyps   . GERD (gastroesophageal reflux disease)   . History of colon polyps   . Hyperlipidemia   .  Hypertension   . Low back pain   . Murmur, heart   . Stroke [facility])     Review of systems:  Otherwise negative.    Physical Exam  Gen: Alert, oriented. Appears stated age.  HEENT: [place]/AT. PERRLA. Lungs: CTA, no wheezes. CV: RR nl S1, S2. Abd: soft, benign, no masses. BS+ Ext: No edema. Pulses 2+    Planned procedures: Proceed with colonoscopy. The patient understands the nature of the planned procedure, indications, risks, alternatives and potential complications including but not limited to bleeding, infection, perforation, damage to internal organs and possible oversedation/side effects from anesthesia. The patient agrees and gives consent to proceed.  Please refer to procedure notes for findings, recommendations and patient disposition/instructions.     Isamar Nazir K. Alice Reichert, M.D. Gastroenterology 01/17/2020  9:43 AM

## 2020-01-18 ENCOUNTER — Encounter: Payer: Self-pay | Admitting: *Deleted

## 2020-09-27 ENCOUNTER — Other Ambulatory Visit: Payer: Self-pay | Admitting: Neurology

## 2020-09-27 DIAGNOSIS — G629 Polyneuropathy, unspecified: Secondary | ICD-10-CM

## 2020-09-27 DIAGNOSIS — M5030 Other cervical disc degeneration, unspecified cervical region: Secondary | ICD-10-CM

## 2020-10-21 ENCOUNTER — Ambulatory Visit
Admission: RE | Admit: 2020-10-21 | Discharge: 2020-10-21 | Disposition: A | Discharge status: Home / Self Care | Payer: Medicare HMO | Source: Ambulatory Visit | Attending: Neurology | Admitting: Neurology

## 2020-10-21 ENCOUNTER — Other Ambulatory Visit: Payer: Self-pay

## 2020-10-21 DIAGNOSIS — G629 Polyneuropathy, unspecified: Secondary | ICD-10-CM

## 2020-10-21 DIAGNOSIS — M5030 Other cervical disc degeneration, unspecified cervical region: Secondary | ICD-10-CM

## 2022-01-10 ENCOUNTER — Encounter: Payer: Self-pay | Admitting: Gastroenterology

## 2022-01-10 NOTE — H&P (Signed)
? ?Pre-Procedure H&P ?  ?Patient ID: Tony Bender is a 67 y.o. male. ? ?Gastroenterology Provider: Annamaria Helling, DO ? ?Referring Provider: Dawson Bills, NP  ?PCP: [facility] ? ?Date: 01/11/2022 ? ?HPI ?Tony Bender is a 66 y.o. male who presents today for Esophagogastroduodenoscopy for GERD. ? ?Patient with increasing reflux symptoms specifically heartburn.  Has been taking 20 mg of Pepcid twice a day and omeprazole 20 mg once daily.  Requiring 2-3 times per day for breakthrough symptoms.  Denies dysphagia odynophagia and weight changes.  Appetite has been stable.  Bowel movements have been regular without melena hematochezia diarrhea or constipation. ?He does have a history of C-spine surgery.  Previous history of tobacco use. ?Actively using 2 Advil tablets per day.  Also on 81 mg aspirin daily ?Hemoglobin 13.1 MCV 91.5 platelets 263,000 creatinine 1.1 ?EGD in 1998 with unknown results. ? ?Past Medical History:  ?Diagnosis Date  ? Disc degeneration, lumbar   ? Disc disorder of cervical region   ? Family history of colonic polyps   ? GERD (gastroesophageal reflux disease)   ? History of colon polyps   ? Hyperlipidemia   ? Hypertension   ? Low back pain   ? Murmur, heart   ? Neuropathy   ? Stroke [facility])   ? ? ?Past Surgical History:  ?Procedure Laterality Date  ? ANTERIOR CERVICAL DECOMP/DISCECTOMY FUSION    ? BACK SURGERY    ? C-SPINE SURGERY    ? COLONOSCOPY WITH PROPOFOL N/A 04/29/2016  ? Procedure: COLONOSCOPY WITH PROPOFOL;  Surgeon: Manya Silvas, MD;  Location: [facility] ENDOSCOPY;  Service: Endoscopy;  Laterality: N/A;  ? COLONOSCOPY WITH PROPOFOL N/A 01/17/2020  ? Procedure: COLONOSCOPY WITH PROPOFOL;  Surgeon: [place], Benay Pike, MD;  Location: [facility] ENDOSCOPY;  Service: Gastroenterology;  Laterality: N/A;  ? EYE SURGERY    ? HEMORRHOID SURGERY    ? KNEE ARTHROSCOPY W/ ACL RECONSTRUCTION    ? TONSILLECTOMY    ? ? ?Family History ?Brother with history of colon polyps ?No h/o GI  disease or malignancy ? ?Review of Systems  ?Constitutional:  Negative for activity change, appetite change, chills, diaphoresis, fatigue, fever and unexpected weight change.  ?HENT:  Negative for trouble swallowing and voice change.   ?Respiratory:  Negative for shortness of breath and wheezing.   ?Cardiovascular:  Negative for chest pain, palpitations and leg swelling.  ?Gastrointestinal:  Negative for abdominal distention, abdominal pain, anal bleeding, blood in stool, constipation, diarrhea, nausea and vomiting.  ?     Increasing reflux symptoms  ?Musculoskeletal:  Negative for arthralgias and myalgias.  ?Skin:  Negative for color change and pallor.  ?Neurological:  Negative for dizziness, syncope and weakness.  ?Psychiatric/Behavioral:  Negative for confusion. The patient is not nervous/anxious.   ?All other systems reviewed and are negative.  ? ?Medications ?No current facility-administered medications on file prior to encounter.  ? ?Current Outpatient Medications on File Prior to Encounter  ?Medication Sig Dispense Refill  ? aspirin EC 81 MG tablet Take 81 mg by mouth daily.    ? cetirizine (ZYRTEC) 10 MG tablet Take 10 mg by mouth daily.    ? cyanocobalamin 2000 MCG tablet Take 2,000 mcg by mouth daily.    ? ibuprofen (ADVIL) 600 MG tablet Take 600 mg by mouth daily.    ? lisinopril-hydrochlorothiazide (PRINZIDE,ZESTORETIC) 20-12.5 MG tablet Take 1 tablet by mouth daily.    ? Multiple Vitamins-Iron (MULTIVITAMIN/IRON PO) Take by mouth.    ?  omeprazole (PRILOSEC) 20 MG capsule Take 20 mg by mouth daily.    ? rosuvastatin (CRESTOR) 5 MG tablet Take 5 mg by mouth daily.    ? Cholecalciferol (VITAMIN D3) 3000 units TABS Take by mouth. (Patient not taking: Reported on 01/11/2022)    ? cyclobenzaprine (FLEXERIL) 10 MG tablet Take 10 mg by mouth 3 (three) times daily as needed for muscle spasms. (Patient not taking: Reported on 01/11/2022)    ? famotidine (PEPCID) 20 MG tablet Take 20 mg by mouth 2 (two) times daily.  (Patient not taking: Reported on 01/11/2022)    ? HYDROcodone-acetaminophen (NORCO) 10-325 MG tablet Take 1 tablet by mouth every 6 (six) hours as needed. (Patient not taking: Reported on 01/11/2022)    ? Omega-3 Fatty Acids (FISH OIL OMEGA-3 PO) Take by mouth. (Patient not taking: Reported on 01/11/2022)    ? ? ?Pertinent medications related to GI and procedure were reviewed by me with the patient prior to the procedure ? ? ?Current Facility-Administered Medications:  ?  0.9 %  sodium chloride infusion, , Intravenous, Continuous, Annamaria Helling, DO, Last Rate: 20 mL/hr at 01/11/22 0744, New Bag at 01/11/22 0744 ?  ?  ? ?Allergies  ?Allergen Reactions  ? Celecoxib Other (See Comments)  ? Lipitor [Atorvastatin]   ? Meloxicam Nausea Only  ? Penicillins   ? ?Allergies were reviewed by me prior to the procedure ? ?Objective  ? ? ?Vitals:  ? 01/11/22 0733  ?BP: 97/77  ?Pulse: 77  ?Resp: 18  ?Temp: (!) 97.2 ?F (36.2 ?C)  ?TempSrc: Temporal  ?SpO2: 100%  ?Weight: 93 kg  ?Height: 6\' 3"  (1.905 m)  ? ? ? ?Physical Exam ?Vitals and nursing note reviewed.  ?Constitutional:   ?   General: He is not in acute distress. ?   Appearance: Normal appearance. He is not ill-appearing, toxic-appearing or diaphoretic.  ?HENT:  ?   Head: Normocephalic and atraumatic.  ?   Nose: Nose normal.  ?   Mouth/Throat:  ?   Mouth: Mucous membranes are moist.  ?   Pharynx: Oropharynx is clear.  ?Eyes:  ?   General: No scleral icterus. ?   Extraocular Movements: Extraocular movements intact.  ?Cardiovascular:  ?   Rate and Rhythm: Normal rate and regular rhythm.  ?   Heart sounds: Normal heart sounds. No murmur heard. ?  No friction rub. No gallop.  ?Pulmonary:  ?   Effort: Pulmonary effort is normal. No respiratory distress.  ?   Breath sounds: Normal breath sounds. No wheezing, rhonchi or rales.  ?Abdominal:  ?   General: Bowel sounds are normal. There is no distension.  ?   Palpations: Abdomen is soft.  ?   Tenderness: There is no abdominal  tenderness. There is no guarding or rebound.  ?Musculoskeletal:  ?   Cervical back: Neck supple.  ?   Right lower leg: No edema.  ?   Left lower leg: No edema.  ?Skin: ?   General: Skin is warm and dry.  ?   Coloration: Skin is not jaundiced or pale.  ?Neurological:  ?   General: No focal deficit present.  ?   Mental Status: He is alert and oriented to person, place, and time. Mental status is at baseline.  ?Psychiatric:     ?   Mood and Affect: Mood normal.     ?   Behavior: Behavior normal.     ?   Thought Content: Thought content normal.     ?  Judgment: Judgment normal.  ? ? ? ?Assessment:  ?Tony Bender is a 66 y.o. male  who presents today for Esophagogastroduodenoscopy for GERD. ? ?Plan:  ?Esophagogastroduodenoscopy with possible intervention today ? ?Esophagogastroduodenoscopy with possible biopsy, control of bleeding, polypectomy, and interventions as necessary has been discussed with the patient/patient representative. Informed consent was obtained from the patient/patient representative after explaining the indication, nature, and risks of the procedure including but not limited to death, bleeding, perforation, missed neoplasm/lesions, cardiorespiratory compromise, and reaction to medications. Opportunity for questions was given and appropriate answers were provided. Patient/patient representative has verbalized understanding is amenable to undergoing the procedure. ? ? ?Annamaria Helling, DO  ?[facility]astroenterology ? ?Portions of the record may have been created with voice recognition software. Occasional wrong-word or 'sound-a-like' substitutions may have occurred due to the inherent limitations of voice recognition software.  Read the chart carefully and recognize, using context, where substitutions may have occurred. ?

## 2022-01-11 ENCOUNTER — Ambulatory Visit
Admission: RE | Admit: 2022-01-11 | Discharge: 2022-01-11 | Disposition: A | Discharge status: Home / Self Care | Payer: Medicare HMO | Attending: Gastroenterology | Admitting: Gastroenterology

## 2022-01-11 ENCOUNTER — Encounter: Payer: Self-pay | Admitting: Gastroenterology

## 2022-01-11 ENCOUNTER — Other Ambulatory Visit: Payer: Self-pay

## 2022-01-11 ENCOUNTER — Ambulatory Visit: Payer: Medicare HMO | Admitting: Certified Registered Nurse Anesthetist

## 2022-01-11 ENCOUNTER — Encounter
Admission: RE | Disposition: A | Discharge status: Home / Self Care | Payer: Self-pay | Source: Home / Self Care | Attending: Gastroenterology

## 2022-01-11 DIAGNOSIS — K219 Gastro-esophageal reflux disease without esophagitis: Secondary | ICD-10-CM | POA: Insufficient documentation

## 2022-01-11 DIAGNOSIS — Z87891 Personal history of nicotine dependence: Secondary | ICD-10-CM | POA: Diagnosis not present

## 2022-01-11 DIAGNOSIS — Z79899 Other long term (current) drug therapy: Secondary | ICD-10-CM | POA: Insufficient documentation

## 2022-01-11 DIAGNOSIS — R12 Heartburn: Secondary | ICD-10-CM | POA: Diagnosis present

## 2022-01-11 DIAGNOSIS — K2970 Gastritis, unspecified, without bleeding: Secondary | ICD-10-CM | POA: Insufficient documentation

## 2022-01-11 DIAGNOSIS — K2980 Duodenitis without bleeding: Secondary | ICD-10-CM | POA: Diagnosis not present

## 2022-01-11 DIAGNOSIS — I10 Essential (primary) hypertension: Secondary | ICD-10-CM | POA: Diagnosis not present

## 2022-01-11 DIAGNOSIS — K317 Polyp of stomach and duodenum: Secondary | ICD-10-CM | POA: Diagnosis not present

## 2022-01-11 HISTORY — PX: ESOPHAGOGASTRODUODENOSCOPY: SHX5428

## 2022-01-11 HISTORY — DX: Polyneuropathy, unspecified: G62.9

## 2022-01-11 SURGERY — EGD (ESOPHAGOGASTRODUODENOSCOPY)
Anesthesia: General

## 2022-01-11 MED ORDER — PHENYLEPHRINE HCL-NACL 20-0.9 MG/250ML-% IV SOLN
INTRAVENOUS | Status: AC
  Filled 2022-01-11: qty 250

## 2022-01-11 MED ORDER — SODIUM CHLORIDE 0.9 % IV SOLN: INTRAVENOUS | Status: DC

## 2022-01-11 MED ORDER — PROPOFOL 500 MG/50ML IV EMUL
INTRAVENOUS | Status: DC | PRN
  Administered 2022-01-11: 150 ug/kg/min via INTRAVENOUS

## 2022-01-11 MED ORDER — LIDOCAINE HCL (CARDIAC) PF 100 MG/5ML IV SOSY
PREFILLED_SYRINGE | INTRAVENOUS | Status: DC | PRN
  Administered 2022-01-11: 100 mg via INTRAVENOUS

## 2022-01-11 MED ORDER — PROPOFOL 10 MG/ML IV BOLUS
INTRAVENOUS | Status: DC | PRN
  Administered 2022-01-11: 80 mg via INTRAVENOUS
  Administered 2022-01-11: 20 mg via INTRAVENOUS

## 2022-01-11 MED ORDER — PROPOFOL 500 MG/50ML IV EMUL
INTRAVENOUS | Status: AC
  Filled 2022-01-11: qty 100

## 2022-01-11 NOTE — Anesthesia Preprocedure Evaluation (Addendum)
Anesthesia Evaluation  ?Patient identified by MRN, date of birth, ID band ?Patient awake ? ? ? ?Reviewed: ?Allergy & Precautions, NPO status , Patient's Chart, lab work & pertinent test results ? ?Airway ?Mallampati: III ? ?TM Distance: >3 FB ?Neck ROM: full ? ? ? Dental ?no notable dental hx. ? ?  ?Pulmonary ?neg pulmonary ROS, former smoker,  ?  ?Pulmonary exam normal ? ? ? ? ? ? ? Cardiovascular ?hypertension, Pt. on medications ?Normal cardiovascular exam ? ? ?  ?Neuro/Psych ?TIAnegative psych ROS  ? GI/Hepatic ?Neg liver ROS, GERD  ,  ?Endo/Other  ?negative endocrine ROS ? Renal/GU ?negative Renal ROS  ?negative genitourinary ?  ?Musculoskeletal ? ? Abdominal ?Normal abdominal exam  (+)   ?Peds ? Hematology ?negative hematology ROS ?(+)   ?Anesthesia Other Findings ?Past Medical History: ?No date: Disc degeneration, lumbar ?No date: Disc disorder of cervical region ?No date: Family history of colonic polyps ?No date: GERD (gastroesophageal reflux disease) ?No date: History of colon polyps ?No date: Hyperlipidemia ?No date: Hypertension ?No date: Low back pain ?No date: Murmur, heart ?No date: Neuropathy ?No date: Stroke [facility]) ? ?Past Surgical History: ?No date: ANTERIOR CERVICAL DECOMP/DISCECTOMY FUSION ?No date: BACK SURGERY ?No date: C-SPINE SURGERY ?04/29/2016: COLONOSCOPY WITH PROPOFOL; N/A ?    Comment:  Procedure: COLONOSCOPY WITH PROPOFOL;  Surgeon: Herbie Baltimore T ?             Vira Agar, MD;  Location: [place];  Service:  ?             Endoscopy;  Laterality: N/A; ?01/17/2020: COLONOSCOPY WITH PROPOFOL; N/A ?    Comment:  Procedure: COLONOSCOPY WITH PROPOFOL;  Surgeon: [place],  ?             Benay Pike, MD;  Location: [facility] ENDOSCOPY;  Service:  ?             Gastroenterology;  Laterality: N/A; ?No date: EYE SURGERY ?No date: HEMORRHOID SURGERY ?No date: KNEE ARTHROSCOPY W/ ACL RECONSTRUCTION ?No date: TONSILLECTOMY ? ? ? ? Reproductive/Obstetrics ?negative OB ROS ? ?   ? ? ? ? ? ? ? ? ? ? ? ? ? ?  ?  ? ? ? ? ? ? ? ?Anesthesia Physical ?Anesthesia Plan ? ?ASA: 2 ? ?Anesthesia Plan: General  ? ?Post-op Pain Management: Minimal or no pain anticipated  ? ?Induction:  ? ?PONV Risk Score and Plan: Propofol infusion and TIVA ? ?Airway Management Planned: Natural Airway and Simple Face Mask ? ?Additional Equipment:  ? ?Intra-op Plan:  ? ?Post-operative Plan:  ? ?Informed Consent: I have reviewed the patients History and Physical, chart, labs and discussed the procedure including the risks, benefits and alternatives for the proposed anesthesia with the patient or authorized representative who has indicated his/her understanding and acceptance.  ? ? ? ?Dental advisory given ? ?Plan Discussed with: Anesthesiologist, CRNA and Surgeon ? ?Anesthesia Plan Comments:   ? ? ? ? ? ?Anesthesia Quick Evaluation ? ?

## 2022-01-11 NOTE — Op Note (Signed)
[facility] ?Gastroenterology ?Patient Name: Tony Bender ?Procedure Date: 01/11/2022 7:18 AM ?MRN: [id number] ?Account #: [id number] ?Date of Birth: 1956-01-20 ?Admit Type: Outpatient ?Age: 66 ?Room: [facility] ENDO ROOM 1 ?Gender: Male ?Note Status: Finalized ?Instrument Name: Upper Endoscope [id number] ?Procedure:             Upper GI endoscopy ?Indications:           Heartburn ?Providers:             Annamaria Helling DO, DO ?Referring MD:          [place] [facility] ?Medicines:             Monitored Anesthesia Care ?Complications:         No immediate complications. Estimated blood loss:  ?                       Minimal. ?Procedure:             Pre-Anesthesia Assessment: ?                       - Prior to the procedure, a History and Physical was  ?                       performed, and patient medications and allergies were  ?                       reviewed. The patient is competent. The risks and  ?                       benefits of the procedure and the sedation options and  ?                       risks were discussed with the patient. All questions  ?                       were answered and informed consent was obtained.  ?                       Patient identification and proposed procedure were  ?                       verified by the physician, the nurse, the anesthetist  ?                       and the technician in the endoscopy suite. Mental  ?                       Status Examination: alert and oriented. Airway  ?                       Examination: normal oropharyngeal airway and neck  ?                       mobility. Respiratory Examination: clear to  ?                       auscultation. CV Examination: RRR, no murmurs, no S3  ?  or S4. Prophylactic Antibiotics: The patient does not  ?                       require prophylactic antibiotics. Prior  ?                       Anticoagulants: The patient has taken no previous  ?                       anticoagulant or  antiplatelet agents. ASA Grade  ?                       Assessment: II - A patient with mild systemic disease.  ?                       After reviewing the risks and benefits, the patient  ?                       was deemed in satisfactory condition to undergo the  ?                       procedure. The anesthesia plan was to use monitored  ?                       anesthesia care (MAC). Immediately prior to  ?                       administration of medications, the patient was  ?                       re-assessed for adequacy to receive sedatives. The  ?                       heart rate, respiratory rate, oxygen saturations,  ?                       blood pressure, adequacy of pulmonary ventilation, and  ?                       response to care were monitored throughout the  ?                       procedure. The physical status of the patient was  ?                       re-assessed after the procedure. ?                       After obtaining informed consent, the endoscope was  ?                       passed under direct vision. Throughout the procedure,  ?                       the patient's blood pressure, pulse, and oxygen  ?                       saturations were monitored continuously. The Endoscope  ?  was introduced through the mouth, and advanced to the  ?                       second part of duodenum. The upper GI endoscopy was  ?                       accomplished without difficulty. The patient tolerated  ?                       the procedure well. ?Findings: ?     Localized moderate inflammation characterized by erosions and erythema  ?     was found in the duodenal bulb and in the first portion of the duodenum.  ?     Estimated blood loss: none. ?     Localized mild inflammation characterized by erosions and erythema was  ?     found in the gastric antrum. Biopsies were taken with a cold forceps for  ?     Helicobacter pylori testing. Estimated blood loss was minimal. ?     A few  less than 1 mm sessile polyps with no bleeding and no stigmata of  ?     recent bleeding were found in the cardia and in the gastric fundus. The  ?     polyp was removed with a cold biopsy forceps. Resection and retrieval  ?     were complete. Estimated blood loss was minimal. ?     The Z-line was regular. Estimated blood loss: none. ?     Esophagogastric landmarks were identified: the gastroesophageal junction  ?     was found at 38 cm from the incisors. ?     The examined esophagus was normal. Estimated blood loss: none. ?Impression:            - Duodenitis. ?                       - Gastritis. Biopsied. ?                       - A few gastric polyps. Resected and retrieved. ?                       - Z-line regular. ?                       - Esophagogastric landmarks identified. ?                       - Normal esophagus. ?Recommendation:        - Discharge patient to home. ?                       - Resume previous diet. ?                       - Continue present medications. ?                       - Increase proton pump inhibitor therapy to twice a  ?                       day, 40 mg for 8 weeks. Prescription to be sent in.  ?  Then can decrease to once daily. ?                       - No aspirin, ibuprofen, naproxen, or other  ?                       non-steroidal anti-inflammatory drugs as these are  ?                       likely causing inflammation and erosions in your  ?                       stomach & duodenum. ?                       - Await pathology results. ?                       - Return to referring physician as previously  ?                       scheduled. ?                       - The findings and recommendations were discussed with  ?                       the patient. ?Procedure Code(s):     --- Professional --- ?                       [phone], Esophagogastroduodenoscopy, flexible,  ?                       transoral; with biopsy, single or multiple ?Diagnosis Code(s):      --- Professional --- ?                       K29.80, Duodenitis without bleeding ?                       K29.70, Gastritis, unspecified, without bleeding ?                       K31.7, Polyp of stomach and duodenum ?                       R12, Heartburn ?CPT copyright 2019 [employer]. All rights reserved. ?The codes documented in this report are preliminary and upon coder review may  ?be revised to meet current compliance requirements. ?Attending Participation: ?     I personally performed the entire procedure. ?Volney American, DO ?Annamaria Helling DO, DO ?01/11/2022 8:20:26 AM ?This report has been signed electronically. ?Number of Addenda: 0 ?Note Initiated On: 01/11/2022 7:18 AM ?Estimated Blood Loss:  Estimated blood loss was minimal. ?     [facility] ?

## 2022-01-11 NOTE — Transfer of Care (Signed)
Immediate Anesthesia Transfer of Care Note ? ?Patient: Tony Bender ? ?Procedure(s) Performed: ESOPHAGOGASTRODUODENOSCOPY (EGD) ? ?Patient Location: Endoscopy Unit ? ?Anesthesia Type:General ? ?Level of Consciousness: awake and alert  ? ?Airway & Oxygen Therapy: Patient Spontanous Breathing ? ?Post-op Assessment: Report given to RN and Post -op Vital signs reviewed and stable ? ?Post vital signs: Reviewed and stable ? ?Last Vitals:  ?Vitals Value Taken Time  ?BP 87/61   ?Temp    ?Pulse 63 01/11/22 0816  ?Resp 9 01/11/22 0816  ?SpO2 100 % 01/11/22 0816  ?Vitals shown include unvalidated device data. ? ?Last Pain:  ?Vitals:  ? 01/11/22 0733  ?TempSrc: Temporal  ?PainSc: 0-No pain  ?   ? ?  ? ?Complications: No notable events documented. ?

## 2022-01-11 NOTE — Interval H&P Note (Signed)
History and Physical Interval Note: Preprocedure H&P from 01/11/22 ? was reviewed and there was no interval change after seeing and examining the patient.  Written consent was obtained from the patient after discussion of risks, benefits, and alternatives. Patient has consented to proceed with Esophagogastroduodenoscopy with possible intervention ? ? ?01/11/2022 ?8:00 AM ? ?Tony Bender  has presented today for surgery, with the diagnosis of Gastroesophageal reflux disease, unspecified whether esophagitis present (K21.9).  The various methods of treatment have been discussed with the patient and family. After consideration of risks, benefits and other options for treatment, the patient has consented to  Procedure(s): ?ESOPHAGOGASTRODUODENOSCOPY (EGD) (N/A) as a surgical intervention.  The patient's history has been reviewed, patient examined, no change in status, stable for surgery.  I have reviewed the patient's chart and labs.  Questions were answered to the patient's satisfaction.   ? ? ?Annamaria Helling ? ? ?

## 2022-01-11 NOTE — Anesthesia Procedure Notes (Signed)
Date/Time: 01/11/2022 8:04 AM ?Performed by: Lily Peer, Reo Portela, CRNA ?Pre-anesthesia Checklist: Patient identified, Emergency Drugs available, Suction available, Patient being monitored and Timeout performed ?Patient Re-evaluated:Patient Re-evaluated prior to induction ?Oxygen Delivery Method: Simple face mask ?Induction Type: IV induction ? ? ? ? ?

## 2022-01-11 NOTE — Anesthesia Postprocedure Evaluation (Signed)
Anesthesia Post Note ? ?Patient: Tony Bender ? ?Procedure(s) Performed: ESOPHAGOGASTRODUODENOSCOPY (EGD) ? ?Patient location during evaluation: PACU ?Anesthesia Type: General ?Level of consciousness: awake and alert ?Pain management: pain level controlled ?Vital Signs Assessment: post-procedure vital signs reviewed and stable ?Respiratory status: spontaneous breathing, nonlabored ventilation and respiratory function stable ?Cardiovascular status: blood pressure returned to baseline and stable ?Postop Assessment: no apparent nausea or vomiting ?Anesthetic complications: no ? ? ?No notable events documented. ? ? ?Last Vitals:  ?Vitals:  ? 01/11/22 0830 01/11/22 0840  ?BP: 117/61 121/67  ?Pulse: 69 (!) 58  ?Resp: 15 17  ?Temp:    ?SpO2: 98% 99%  ?  ?Last Pain:  ?Vitals:  ? 01/11/22 0810  ?TempSrc: Temporal  ?PainSc:   ? ? ?  ?  ?  ?  ?  ?  ? ?[place] Ouch ? ? ? ? ?

## 2022-01-14 ENCOUNTER — Encounter: Payer: Self-pay | Admitting: Gastroenterology

## 2022-01-14 LAB — SURGICAL PATHOLOGY

## 2022-10-28 ENCOUNTER — Other Ambulatory Visit: Payer: Self-pay | Admitting: Surgery

## 2022-11-06 ENCOUNTER — Other Ambulatory Visit: Payer: Self-pay

## 2022-11-06 ENCOUNTER — Encounter
Admission: RE | Admit: 2022-11-06 | Discharge: 2022-11-06 | Disposition: A | Discharge status: Home / Self Care | Payer: Medicare HMO | Source: Ambulatory Visit | Attending: Surgery | Admitting: Surgery

## 2022-11-06 DIAGNOSIS — Z01812 Encounter for preprocedural laboratory examination: Secondary | ICD-10-CM

## 2022-11-06 HISTORY — DX: Myoneural disorder, unspecified: G70.9

## 2022-11-06 HISTORY — DX: Anemia, unspecified: D64.9

## 2022-11-06 HISTORY — DX: Pneumonia, unspecified organism: J18.9

## 2022-11-06 NOTE — Patient Instructions (Addendum)
Your procedure is scheduled on:11/13/21 - Wednesday Report to the Registration Desk on the 1st floor of the [facility]. To find out your arrival time, please call [phone] between 1PM - 3PM on: 11/12/21 - Tuesday If your arrival time is 6:00 am, do not arrive prior to that time as the [place] entrance doors do not open until 6:00 am.  REMEMBER: Instructions that are not followed completely may result in serious medical risk, up to and including death; or upon the discretion of your surgeon and anesthesiologist your surgery may need to be rescheduled.  Do not eat food after midnight the night before surgery.  No gum chewing, lozengers or hard candies.  You may however, drink CLEAR liquids up to 2 hours before you are scheduled to arrive for your surgery. Do not drink anything within 2 hours of your scheduled arrival time.  Clear liquids include: - water  - apple juice without pulp - gatorade (not RED colors) - black coffee or tea (Do NOT add milk or creamers to the coffee or tea) Do NOT drink anything that is not on this list.  In addition, your doctor has ordered for you to drink the provided  Ensure Pre-Surgery Clear Carbohydrate Drink  Drinking this carbohydrate drink up to two hours before surgery helps to reduce insulin resistance and improve patient outcomes. Please complete drinking 2 hours prior to scheduled arrival time.  TAKE THESE MEDICATIONS THE MORNING OF SURGERY WITH A SIP OF WATER:  - omeprazole (PRILOSEC) -(take one the night before and one on the morning of surgery - helps to prevent nausea after surgery.)  One week prior to surgery stop beginning 11/06/22  Anti-inflammatories (NSAIDS) such as Advil, Aleve, Ibuprofen, Motrin, Naproxen, Naprosyn and Aspirin based products such as Excedrin, Goodys Powder, BC Powder.  Stop ANY OVER THE COUNTER supplements until after surgery.  You may however, continue to take Tylenol if needed for pain up until the day of  surgery.  No Alcohol for 24 hours before or after surgery.  No Smoking including e-cigarettes for 24 hours prior to surgery.  No chewable tobacco products for at least 6 hours prior to surgery.  No nicotine patches on the day of surgery.  Do not use any "recreational" drugs for at least a week prior to your surgery.  Please be advised that the combination of cocaine and anesthesia may have negative outcomes, up to and including death. If you test positive for cocaine, your surgery will be cancelled.  On the morning of surgery brush your teeth with toothpaste and water, you may rinse your mouth with mouthwash if you wish. Do not swallow any toothpaste or mouthwash.  Use CHG Soap or wipes as directed on instruction sheet.  Do not wear jewelry, make-up, hairpins, clips or nail polish.  Do not wear lotions, powders, or perfumes.   Do not shave body from the neck down 48 hours prior to surgery just in case you cut yourself which could leave a site for infection.  Also, freshly shaved skin may become irritated if using the CHG soap.  Contact lenses, hearing aids and dentures may not be worn into surgery.  Do not bring valuables to the hospital. [facility] is not responsible for any missing/lost belongings or valuables.   Notify your doctor if there is any change in your medical condition (cold, fever, infection).  Wear comfortable clothing (specific to your surgery type) to the hospital.  After surgery, you can help prevent lung complications by  doing breathing exercises.  Take deep breaths and cough every 1-2 hours. Your doctor may order a device called an Incentive Spirometer to help you take deep breaths. When coughing or sneezing, hold a pillow firmly against your incision with both hands. This is called "splinting." Doing this helps protect your incision. It also decreases belly discomfort.  If you are being admitted to the hospital overnight, leave your suitcase in the car. After  surgery it may be brought to your room.  If you are being discharged the day of surgery, you will not be allowed to drive home. You will need a responsible adult (18 years or older) to drive you home and stay with you that night.   If you are taking public transportation, you will need to have a responsible adult (18 years or older) with you. Please confirm with your physician that it is acceptable to use public transportation.   Please call the [place] Dept. at [phone] if you have any questions about these instructions.  Surgery Visitation Policy:  Patients undergoing a surgery or procedure may have two family members or support persons with them as long as the person is not COVID-19 positive or experiencing its symptoms.   Inpatient Visitation:    Visiting hours are 7 a.m. to 8 p.m. Up to four visitors are allowed at one time in a patient room. The visitors may rotate out with other people during the day. One designated support person (adult) may remain overnight.  Due to an increase in RSV and influenza rates and associated hospitalizations, children ages 40 and under will not be able to visit patients in [facility]. Masks continue to be strongly recommended.

## 2022-11-08 ENCOUNTER — Encounter
Admission: RE | Admit: 2022-11-08 | Discharge: 2022-11-08 | Disposition: A | Discharge status: Home / Self Care | Payer: Medicare HMO | Source: Ambulatory Visit | Attending: Surgery | Admitting: Surgery

## 2022-11-08 DIAGNOSIS — Z01818 Encounter for other preprocedural examination: Secondary | ICD-10-CM | POA: Insufficient documentation

## 2022-11-08 DIAGNOSIS — Z01812 Encounter for preprocedural laboratory examination: Secondary | ICD-10-CM

## 2022-11-08 LAB — CBC
HCT: 41.5 % (ref 39.0–52.0)
Hemoglobin: 13.1 g/dL (ref 13.0–17.0)
MCH: 28.8 pg (ref 26.0–34.0)
MCHC: 31.6 g/dL (ref 30.0–36.0)
MCV: 91.2 fL (ref 80.0–100.0)
Platelets: 254 10*3/uL (ref 150–400)
RBC: 4.55 MIL/uL (ref 4.22–5.81)
RDW: 13.3 % (ref 11.5–15.5)
WBC: 5.8 10*3/uL (ref 4.0–10.5)
nRBC: 0 % (ref 0.0–0.2)

## 2022-11-08 LAB — BASIC METABOLIC PANEL
Anion gap: 9 (ref 5–15)
BUN: 18 mg/dL (ref 8–23)
CO2: 29 mmol/L (ref 22–32)
Calcium: 9.5 mg/dL (ref 8.9–10.3)
Chloride: 100 mmol/L (ref 98–111)
Creatinine, Ser: 1.15 mg/dL (ref 0.61–1.24)
GFR, Estimated: >60 mL/min (ref >60)
Glucose, Bld: 116 mg/dL — ABNORMAL HIGH (ref 70–99)
Potassium: 4.2 mmol/L (ref 3.5–5.1)
Sodium: 138 mmol/L (ref 135–145)

## 2022-11-12 MED ORDER — LACTATED RINGERS IV SOLN: INTRAVENOUS | Status: DC

## 2022-11-12 MED ORDER — VANCOMYCIN HCL IN DEXTROSE 1-5 GM/200ML-% IV SOLN: 1000.0000 mg | INTRAVENOUS | Status: DC

## 2022-11-12 MED ORDER — ORAL CARE MOUTH RINSE: 15.0000 mL | Freq: Once | OROMUCOSAL | Status: AC

## 2022-11-12 MED ORDER — CHLORHEXIDINE GLUCONATE 0.12 % MT SOLN: 15.0000 mL | Freq: Once | OROMUCOSAL | Status: AC

## 2022-11-13 ENCOUNTER — Other Ambulatory Visit: Payer: Self-pay

## 2022-11-13 ENCOUNTER — Encounter: Payer: Self-pay | Admitting: Surgery

## 2022-11-13 ENCOUNTER — Ambulatory Visit
Admission: RE | Admit: 2022-11-13 | Discharge: 2022-11-13 | Disposition: A | Discharge status: Home / Self Care | Payer: Medicare HMO | Attending: Surgery | Admitting: Surgery

## 2022-11-13 ENCOUNTER — Ambulatory Visit: Payer: Medicare HMO | Admitting: Anesthesiology

## 2022-11-13 ENCOUNTER — Encounter
Admission: RE | Disposition: A | Discharge status: Home / Self Care | Payer: Self-pay | Source: Home / Self Care | Attending: Surgery

## 2022-11-13 DIAGNOSIS — Z87891 Personal history of nicotine dependence: Secondary | ICD-10-CM | POA: Insufficient documentation

## 2022-11-13 DIAGNOSIS — Z86010 Personal history of colonic polyps: Secondary | ICD-10-CM | POA: Insufficient documentation

## 2022-11-13 DIAGNOSIS — K219 Gastro-esophageal reflux disease without esophagitis: Secondary | ICD-10-CM | POA: Diagnosis not present

## 2022-11-13 DIAGNOSIS — G629 Polyneuropathy, unspecified: Secondary | ICD-10-CM | POA: Insufficient documentation

## 2022-11-13 DIAGNOSIS — Z8673 Personal history of transient ischemic attack (TIA), and cerebral infarction without residual deficits: Secondary | ICD-10-CM | POA: Insufficient documentation

## 2022-11-13 DIAGNOSIS — I10 Essential (primary) hypertension: Secondary | ICD-10-CM | POA: Diagnosis not present

## 2022-11-13 DIAGNOSIS — G5601 Carpal tunnel syndrome, right upper limb: Secondary | ICD-10-CM | POA: Diagnosis present

## 2022-11-13 DIAGNOSIS — E785 Hyperlipidemia, unspecified: Secondary | ICD-10-CM | POA: Diagnosis not present

## 2022-11-13 HISTORY — PX: CARPAL TUNNEL RELEASE: SHX101

## 2022-11-13 SURGERY — RELEASE, CARPAL TUNNEL, ENDOSCOPIC
Anesthesia: General | Site: Wrist | Laterality: Right

## 2022-11-13 MED ORDER — OXYCODONE HCL 5 MG PO TABS
5.0000 mg | ORAL_TABLET | Freq: Once | ORAL | Status: AC | PRN
  Administered 2022-11-13: 5 mg via ORAL

## 2022-11-13 MED ORDER — ACETAMINOPHEN 10 MG/ML IV SOLN
INTRAVENOUS | Status: AC
  Filled 2022-11-13: qty 100

## 2022-11-13 MED ORDER — BUPIVACAINE HCL (PF) 0.5 % IJ SOLN
INTRAMUSCULAR | Status: AC
  Filled 2022-11-13: qty 30

## 2022-11-13 MED ORDER — PHENYLEPHRINE HCL (PRESSORS) 10 MG/ML IV SOLN
INTRAVENOUS | Status: DC | PRN
  Administered 2022-11-13: 40 ug via INTRAVENOUS

## 2022-11-13 MED ORDER — DEXAMETHASONE SODIUM PHOSPHATE 10 MG/ML IJ SOLN
INTRAMUSCULAR | Status: DC | PRN
  Administered 2022-11-13: 10 mg via INTRAVENOUS

## 2022-11-13 MED ORDER — FENTANYL CITRATE (PF) 100 MCG/2ML IJ SOLN
INTRAMUSCULAR | Status: DC | PRN
  Administered 2022-11-13: 50 ug via INTRAVENOUS

## 2022-11-13 MED ORDER — ONDANSETRON HCL 4 MG/2ML IJ SOLN: 4.0000 mg | Freq: Once | INTRAMUSCULAR | Status: DC | PRN

## 2022-11-13 MED ORDER — PROPOFOL 10 MG/ML IV BOLUS
INTRAVENOUS | Status: DC | PRN
  Administered 2022-11-13: 150 mg via INTRAVENOUS
  Administered 2022-11-13: 30 mg via INTRAVENOUS

## 2022-11-13 MED ORDER — CEFAZOLIN SODIUM-DEXTROSE 2-4 GM/100ML-% IV SOLN
2.0000 g | Freq: Once | INTRAVENOUS | Status: AC
  Administered 2022-11-13: 2 g via INTRAVENOUS

## 2022-11-13 MED ORDER — ACETAMINOPHEN 10 MG/ML IV SOLN: 1000.0000 mg | Freq: Once | INTRAVENOUS | Status: DC | PRN

## 2022-11-13 MED ORDER — LIDOCAINE HCL (CARDIAC) PF 100 MG/5ML IV SOSY
PREFILLED_SYRINGE | INTRAVENOUS | Status: DC | PRN
  Administered 2022-11-13: 80 mg via INTRAVENOUS

## 2022-11-13 MED ORDER — ONDANSETRON HCL 4 MG/2ML IJ SOLN
INTRAMUSCULAR | Status: AC
  Filled 2022-11-13: qty 2

## 2022-11-13 MED ORDER — 0.9 % SODIUM CHLORIDE (POUR BTL) OPTIME
TOPICAL | Status: DC | PRN
  Administered 2022-11-13: 500 mL

## 2022-11-13 MED ORDER — HYDROCODONE-ACETAMINOPHEN 5-325 MG PO TABS: 1.0000 | ORAL_TABLET | ORAL | Status: DC | PRN

## 2022-11-13 MED ORDER — DEXAMETHASONE SODIUM PHOSPHATE 10 MG/ML IJ SOLN
INTRAMUSCULAR | Status: AC
  Filled 2022-11-13: qty 1

## 2022-11-13 MED ORDER — ONDANSETRON HCL 4 MG/2ML IJ SOLN
INTRAMUSCULAR | Status: DC | PRN
  Administered 2022-11-13: 4 mg via INTRAVENOUS

## 2022-11-13 MED ORDER — ACETAMINOPHEN 10 MG/ML IV SOLN
INTRAVENOUS | Status: DC | PRN
  Administered 2022-11-13: 1000 mg via INTRAVENOUS

## 2022-11-13 MED ORDER — MIDAZOLAM HCL 2 MG/2ML IJ SOLN
INTRAMUSCULAR | Status: DC | PRN
  Administered 2022-11-13: 2 mg via INTRAVENOUS

## 2022-11-13 MED ORDER — MIDAZOLAM HCL 2 MG/2ML IJ SOLN
INTRAMUSCULAR | Status: AC
  Filled 2022-11-13: qty 2

## 2022-11-13 MED ORDER — FENTANYL CITRATE (PF) 100 MCG/2ML IJ SOLN: 25.0000 ug | INTRAMUSCULAR | Status: DC | PRN

## 2022-11-13 MED ORDER — OXYCODONE HCL 5 MG/5ML PO SOLN: 5.0000 mg | Freq: Once | ORAL | Status: AC | PRN

## 2022-11-13 MED ORDER — LIDOCAINE HCL (PF) 2 % IJ SOLN
INTRAMUSCULAR | Status: AC
  Filled 2022-11-13: qty 5

## 2022-11-13 MED ORDER — PHENYLEPHRINE 80 MCG/ML (10ML) SYRINGE FOR IV PUSH (FOR BLOOD PRESSURE SUPPORT)
PREFILLED_SYRINGE | INTRAVENOUS | Status: AC
  Filled 2022-11-13: qty 10

## 2022-11-13 MED ORDER — FENTANYL CITRATE (PF) 100 MCG/2ML IJ SOLN
INTRAMUSCULAR | Status: AC
  Filled 2022-11-13: qty 2

## 2022-11-13 MED ORDER — PROPOFOL 10 MG/ML IV BOLUS
INTRAVENOUS | Status: AC
  Filled 2022-11-13: qty 20

## 2022-11-13 MED ORDER — HYDROCODONE-ACETAMINOPHEN 10-325 MG PO TABS: 1.0000 | ORAL_TABLET | Freq: Four times a day (QID) | ORAL | 0 refills | Status: AC | PRN

## 2022-11-13 MED ORDER — BUPIVACAINE HCL (PF) 0.5 % IJ SOLN
INTRAMUSCULAR | Status: DC | PRN
  Administered 2022-11-13: 10 mL

## 2022-11-13 MED ORDER — CHLORHEXIDINE GLUCONATE 0.12 % MT SOLN
OROMUCOSAL | Status: AC
  Administered 2022-11-13: 15 mL via OROMUCOSAL
  Filled 2022-11-13: qty 15

## 2022-11-13 MED ORDER — OXYCODONE HCL 5 MG PO TABS
ORAL_TABLET | ORAL | Status: AC
  Filled 2022-11-13: qty 1

## 2022-11-13 MED ORDER — VANCOMYCIN HCL IN DEXTROSE 1-5 GM/200ML-% IV SOLN
INTRAVENOUS | Status: AC
  Filled 2022-11-13: qty 200

## 2022-11-13 MED ORDER — CEFAZOLIN SODIUM-DEXTROSE 2-4 GM/100ML-% IV SOLN
INTRAVENOUS | Status: AC
  Filled 2022-11-13: qty 100

## 2022-11-13 SURGICAL SUPPLY — 34 items
APL PRP STRL LF DISP 70% ISPRP (MISCELLANEOUS) ×1
BNDG CMPR 5X4 CHSV STRCH STRL (GAUZE/BANDAGES/DRESSINGS) ×1
BNDG COHESIVE 4X5 TAN STRL LF (GAUZE/BANDAGES/DRESSINGS) ×1 IMPLANT
BNDG ELASTIC 2X5.8 VLCR STR LF (GAUZE/BANDAGES/DRESSINGS) ×1 IMPLANT
BNDG ESMARK 4X12 TAN STRL LF (GAUZE/BANDAGES/DRESSINGS) ×1 IMPLANT
CHLORAPREP W/TINT 26 (MISCELLANEOUS) ×1 IMPLANT
CORD BIP STRL DISP 12FT (MISCELLANEOUS) ×1 IMPLANT
CUFF TOURN SGL QUICK 18X4 (TOURNIQUET CUFF) ×1 IMPLANT
DRAPE SURG 17X11 SM STRL (DRAPES) ×1 IMPLANT
FORCEPS JEWEL BIP 4-3/4 STR (INSTRUMENTS) ×1 IMPLANT
GAUZE SPONGE 4X4 12PLY STRL (GAUZE/BANDAGES/DRESSINGS) ×1 IMPLANT
GAUZE XEROFORM 1X8 LF (GAUZE/BANDAGES/DRESSINGS) ×1 IMPLANT
GLOVE BIO SURGEON STRL SZ8 (GLOVE) ×1 IMPLANT
GLOVE SURG UNDER LTX SZ8 (GLOVE) ×1 IMPLANT
GOWN STRL REUS W/ TWL LRG LVL3 (GOWN DISPOSABLE) ×1 IMPLANT
GOWN STRL REUS W/ TWL XL LVL3 (GOWN DISPOSABLE) ×1 IMPLANT
GOWN STRL REUS W/TWL LRG LVL3 (GOWN DISPOSABLE) ×1
GOWN STRL REUS W/TWL XL LVL3 (GOWN DISPOSABLE) ×1
KIT CARPAL TUNNEL (MISCELLANEOUS) ×1
KIT ESCP INSRT D SLOT CANN KN (MISCELLANEOUS) ×1 IMPLANT
KIT TURNOVER KIT A (KITS) ×1 IMPLANT
MANIFOLD NEPTUNE II (INSTRUMENTS) ×1 IMPLANT
NS IRRIG 500ML POUR BTL (IV SOLUTION) ×1 IMPLANT
PACK EXTREMITY ARMC (MISCELLANEOUS) ×1 IMPLANT
SPLINT WRIST LG LT TX990309 (SOFTGOODS) IMPLANT
SPLINT WRIST LG RT TX900304 (SOFTGOODS) IMPLANT
SPLINT WRIST M LT TX990308 (SOFTGOODS) IMPLANT
SPLINT WRIST M RT TX990303 (SOFTGOODS) IMPLANT
SPLINT WRIST XL LT TX990310 (SOFTGOODS) IMPLANT
SPLINT WRIST XL RT TX990305 (SOFTGOODS) IMPLANT
STOCKINETTE IMPERVIOUS 9X36 MD (GAUZE/BANDAGES/DRESSINGS) ×1 IMPLANT
SUT PROLENE 4 0 PS 2 18 (SUTURE) ×1 IMPLANT
TRAP FLUID SMOKE EVACUATOR (MISCELLANEOUS) ×1 IMPLANT
WATER STERILE IRR 500ML POUR (IV SOLUTION) ×1 IMPLANT

## 2022-11-13 NOTE — Discharge Instructions (Addendum)
Orthopedic discharge instructions: Keep dressing dry and intact. Keep hand elevated above heart level. May shower after dressing removed on postop day 4 (Sunday). Cover sutures with Band-Aids after drying off, then reapply Velcro splint. Apply ice to affected area frequently. Take ibuprofen 600 mg TID with meals for 3-5 days, then as necessary. Take ES Tylenol or pain medication as prescribed when needed.  Return for follow-up in 10-14 days or as scheduled.AMBULATORY SURGERY  DISCHARGE INSTRUCTIONS   The drugs that you were given will stay in your system until tomorrow so for the next 24 hours you should not:  Drive an automobile Make any legal decisions Drink any alcoholic beverage   You may resume regular meals tomorrow.  Today it is better to start with liquids and gradually work up to solid foods.  You may eat anything you prefer, but it is better to start with liquids, then soup and crackers, and gradually work up to solid foods.   Please notify your doctor immediately if you have any unusual bleeding, trouble breathing, redness and pain at the surgery site, drainage, fever, or pain not relieved by medication.    Your post-operative visit with Dr.                                       is: Date:                        Time:    Please call to schedule your post-operative visit.  Additional Instructions:

## 2022-11-13 NOTE — Op Note (Signed)
11/13/2022  12:17 PM  Patient:   Tony Bender  Pre-Op Diagnosis:   Right carpal tunnel syndrome.  Post-Op Diagnosis:   Same.  Procedure:   Endoscopic right carpal tunnel release.  Surgeon:   Pascal Lux, MD  Anesthesia:   General LMA  Findings:   As above.  Complications:   None  EBL:   0 cc  Fluids:   600 cc crystalloid  TT:   12 minutes at 250 mmHg  Drains:   None  Closure:   4-0 Prolene interrupted sutures  Brief Clinical Note:   The patient is a 67 year old male with a history of progressively worsening pain and paresthesias to his right hand. His symptoms have progressed despite medications, activity modification, etc. His history and examination are consistent with carpal tunnel syndrome, confirmed by EMG. The patient presents at this time for an endoscopic right carpal tunnel release.   Procedure:   The patient was brought into the operating room and lain in the supine position. After adequate general laryngeal mask anesthesia was obtained, the right hand and upper extremity were prepped with ChloraPrep solution before being draped sterilely. Preoperative antibiotics were administered. A timeout was performed to verify the appropriate surgical site before the limb was exsanguinated with an Esmarch and the tourniquet inflated to 250 mmHg.   An approximately 1.5-2 cm incision was made over the volar wrist flexion crease, centered over the palmaris longus tendon. The incision was carried down through the subcutaneous tissues with care taken to identify and protect any neurovascular structures. The distal forearm fascia was penetrated just proximal to the transverse carpal ligament. The soft tissues were released off the superficial and deep surfaces of the distal forearm fascia and this was released proximally for 3-4 cm under direct visualization.  Attention was directed distally. The Soil scientist was passed beneath the transverse carpal ligament along the ulnar  aspect of the carpal tunnel and used to release any adhesions as well as to remove any adherent synovial tissue before first the smaller then the larger of the two dilators were passed beneath the transverse carpal ligament along the ulnar margin of the carpal tunnel. The slotted cannula was introduced and the endoscope was placed into the slotted cannula and the undersurface of the transverse carpal ligament visualized. The distal margin of the transverse carpal ligament was marked by placing a 25-gauge needle percutaneously at Ephesus cardinal point so that it entered the distal portion of the slotted cannula. Under endoscopic visualization, the transverse carpal ligament was released from proximal to distal using the end-cutting blade. A second pass was performed to ensure complete release of the ligament. The adequacy of release was verified both endoscopically and by palpation using the freer elevator.  The wound was irrigated thoroughly with sterile saline solution before being closed using 4-0 Prolene interrupted sutures. A total of 10 cc of 0.5% plain Sensorcaine was injected in and around the incision before a sterile bulky dressing was applied to the wound. The patient was placed into a volar wrist splint before being awakened, extubated, and returned to the recovery room in satisfactory condition after tolerating the procedure well.

## 2022-11-13 NOTE — Anesthesia Postprocedure Evaluation (Signed)
Anesthesia Post Note  Patient: Tony Bender  Procedure(s) Performed: CARPAL TUNNEL RELEASE (Right: Wrist)  Patient location during evaluation: PACU Anesthesia Type: General Level of consciousness: awake and alert Pain management: pain level controlled Vital Signs Assessment: post-procedure vital signs reviewed and stable Respiratory status: spontaneous breathing, nonlabored ventilation, respiratory function stable and patient connected to nasal cannula oxygen Cardiovascular status: blood pressure returned to baseline and stable Postop Assessment: no apparent nausea or vomiting Anesthetic complications: no   No notable events documented.   Last Vitals:  Vitals:   11/13/22 1243 11/13/22 1304  BP: 130/80 (!) 143/95  Pulse: 62 68  Resp: 11 20  Temp:  (!) 36.1 C  SpO2: 100% 99%    Last Pain:  Vitals:   11/13/22 1304  TempSrc:   PainSc: 0-No pain                 Arita Miss

## 2022-11-13 NOTE — Transfer of Care (Signed)
Immediate Anesthesia Transfer of Care Note  Patient: Tony Bender  Procedure(s) Performed: Procedure(s): CARPAL TUNNEL RELEASE (Right)  Patient Location: PACU  Anesthesia Type:General  Level of Consciousness: sedated  Airway & Oxygen Therapy: Patient Spontanous Breathing and Patient connected to face mask oxygen  Post-op Assessment: Report given to RN and Post -op Vital signs reviewed and stable  Post vital signs: Reviewed and stable  Last Vitals:  Vitals:   11/13/22 1039 11/13/22 1221  BP: (!) 153/74 125/75  Pulse: 80 (!) 54  Resp: 15 13  Temp: (!) 36.1 C (!) 36.3 C  SpO2: 96% 222%    Complications: No apparent anesthesia complications

## 2022-11-13 NOTE — Anesthesia Preprocedure Evaluation (Addendum)
Anesthesia Evaluation  Patient identified by MRN, date of birth, ID band Patient awake    Reviewed: Allergy & Precautions, NPO status , Patient's Chart, lab work & pertinent test results  History of Anesthesia Complications Negative for: history of anesthetic complications  Airway Mallampati: III  TM Distance: >3 FB Neck ROM: Full    Dental no notable dental hx. (+) Teeth Intact, Caps   Pulmonary neg pulmonary ROS, neg sleep apnea, neg COPD, Patient abstained from smoking.Not current smoker, former smoker   Pulmonary exam normal breath sounds clear to auscultation       Cardiovascular Exercise Tolerance: Good METShypertension, (-) CAD and (-) Past MI (-) dysrhythmias  Rhythm:Regular Rate:Normal - Systolic murmurs    Neuro/Psych CVA, No Residual Symptoms  negative psych ROS   GI/Hepatic ,GERD  Medicated and Controlled,,(+)     (-) substance abuse    Endo/Other  neg diabetes    Renal/GU negative Renal ROS     Musculoskeletal   Abdominal   Peds  Hematology   Anesthesia Other Findings Past Medical History: No date: Anemia No date: Disc degeneration, lumbar No date: Disc disorder of cervical region No date: Family history of colonic polyps No date: GERD (gastroesophageal reflux disease) No date: History of colon polyps No date: Hyperlipidemia No date: Hypertension No date: Low back pain No date: Murmur, heart No date: Neuromuscular disorder (HCC) No date: Neuropathy No date: Pneumonia No date: Stroke [facility])  Reproductive/Obstetrics                              Anesthesia Physical Anesthesia Plan  ASA: 2  Anesthesia Plan: General   Post-op Pain Management: Ofirmev IV (intra-op)*   Induction: Intravenous  PONV Risk Score and Plan: 2 and Ondansetron and Dexamethasone  Airway Management Planned: LMA  Additional Equipment: None  Intra-op Plan:   Post-operative Plan:  Extubation in OR  Informed Consent: I have reviewed the patients History and Physical, chart, labs and discussed the procedure including the risks, benefits and alternatives for the proposed anesthesia with the patient or authorized representative who has indicated his/her understanding and acceptance.     Dental advisory given  Plan Discussed with: CRNA and Surgeon  Anesthesia Plan Comments: (Discussed risks of anesthesia with patient, including PONV, sore throat, lip/dental/eye damage. Rare risks discussed as well, such as cardiorespiratory and neurological sequelae, and allergic reactions. Discussed the role of CRNA in patient's perioperative care. Patient understands. Patient has listed allergy to PCN - hives on abdomen 40 years ago Severe blistering skin reaction (SJS/TEN)? no Liver or kidney injury caused by PCN? no Hemolytic anemia from PCN? no Drug fever? no Painful swollen joints? no Severe reaction involving inside of mouth, eye, or genital ulcers? no Based on current evidence Alfonse Alpers et al, J Allergy Clin Immunol Pract, 2019), will proceed with cefazolin use: Yes  )         Anesthesia Quick Evaluation

## 2022-11-13 NOTE — H&P (Signed)
History of Present Illness: Patient is a 67 year old male that presented for reevaluation of his right wrist. The patient was seen several months ago for right wrist carpal tunnel syndrome. They did put off his surgery. He is scheduled for a right endoscopic carpal tunnel release to be done by Dr. Roland Rack on November 13, 2021. He states that for several years he has been having off-and-on numbness and progressive pain involving his bilateral hands. He was diagnosed with moderate carpal tunnel syndrome back in 2021 when he saw Dr. Manuella Ghazi. He had a left carpal tunnel wrist injection that helped. His right wrist was not injected but he continues to have pain and weakness as well as difficulty with gripping. He is dropping objects occasionally. He has trouble at nighttime. He has tried a wrist splint with no relief. The patient is not a diabetic.  Medications: aspirin 81 MG EC tablet Take 81 mg by mouth once daily.  cyanocobalamin 2000 MCG tablet Take 1,000 mcg by mouth once daily  cyclobenzaprine (FLEXERIL) 5 MG tablet Take 1 tablet (5 mg total) by mouth 3 (three) times daily as needed for Muscle spasms 30 tablet 6  lisinopril-hydrochlorothiazide (PRINZIDE,ZESTORETIC) 20-12.5 mg tablet Take 1 tablet by mouth once daily.  multivit with iron,minerals (FLINTSTONES COMPLETE, IRON, ORAL) Take by mouth  omeprazole (PRILOSEC) 40 MG DR capsule Take 1 capsule (40 mg total) by mouth once daily 90 capsule 3  rosuvastatin (CRESTOR) 10 MG tablet Take 10 mg by mouth once daily.  cetirizine (ZYRTEC) 10 MG tablet Take 10 mg by mouth once daily (Patient not taking: Reported on 10/02/2022)  famotidine (PEPCID) 20 MG tablet Take 1 tablet (20 mg total) by mouth at bedtime as needed for Heartburn (Patient not taking: Reported on 10/02/2022) 90 tablet 2  ibuprofen (MOTRIN) 200 MG tablet Take 600 mg by mouth once daily (Patient not taking: Reported on 11/07/2022)  traMADoL (ULTRAM) 50 mg tablet Take 1 tablet (50 mg total) by mouth  every 6 (six) hours as needed for Pain for up to 30 doses (Patient not taking: Reported on 11/07/2022) 30 tablet 0   Allergies: Atorvastatin, Celecoxib, Penicillin, and Meloxicam  Past Medical History: Arthritis  Chronic low back pain  Heart murmur  History of myopia  Hyperlipidemia  Hypertension  Neuropathy  Stroke (CMS-HCC)   Past Surgical History: EGD 09/28/1997  COLONOSCOPY 04/29/2016 (Adenomatous Polyps, FH Colon Polyps (Brother): CBF 04/2019) COLONOSCOPY 01/17/2020 ([place] Adenomatous Polyps; FHCP (Brother) CBF 01/2025)  EGD 01/11/2022 (Minute hyperplastic gastric polyp/Gastritis/Duodenitis/No repeat/SMR)  C-spine  Hemorrhoid surgery  L-spine x2  Lasix bilateral eyes  Multiple knee surgeries  Resection of a blood clot of the right buttock as a baby  SPINE SURGERY  TONSILLECTOMY   Social History:  Socioeconomic History:  Marital status: Married  Tobacco Use  Smoking status: Former  Smokeless tobacco: Former  Substance and Sexual Activity  Alcohol use: Not Currently  Alcohol/week: 0.0 standard drinks of alcohol  Comment: seldom  Drug use: No  Sexual activity: Not Currently   Family History: High blood pressure (Hypertension) Father  Myocardial Infarction (Heart attack) Father  Alzheimer's disease Father  Dementia Father  Colon polyps Brother  Cancer Brother  Coronary Artery Disease (Blocked arteries around heart) Brother  Myocardial Infarction (Heart attack) Brother  Myocardial Infarction (Heart attack) Paternal Grandfather   Review of Systems A comprehensive 14 point ROS was performed, reviewed, and the pertinent orthopaedic findings are documented in the HPI.  Physical Exam: BP 130/81  Pulse 83  Ht 190.5 cm (6'  3")  Wt 98.9 kg (218 lb)  BMI 27.25 kg/m   General/Constitutional: NAD, conversant Eyes: Pupils equal and round, extraocular movements intact ENT: atraumatic external nose and ears, moist mucous membranes Respiratory: non-labored breathing,  symmetric chest rise, chest sounds clear. Cardiovascular: no visible lower extremity edema, peripheral pulses present, regular rate and rhythm  Skin: normal skin turgor, warm and dry Neurological: cranial nerves grossly intact, sensation grossly intact Psychological: Appropriate mood and affect; appropriate judgment Musculoskeletal: as detailed below:  General: Well developed, well nourished 67 y.o. male in no apparent distress. Normal affect. Normal communication. Patient answers questions appropriately. The patient has a normal gait. There is no antalgic component. There is no hip lurch.   Neck: Neck has full range of motion. There is no tenderness to palpation. Spurling's test is negative.   Right upper Extremity: Normal shoulder contour. Good active and passive range of motion and stability of the shoulder, elbow, and wrist. Normal motion of the hand and digits. No swelling, erythema, or ecchymosis is noted. There is no triggering or locking of the digits noted. Right-sided thenar atrophy is visualized. No intrinsic wasting. The patient has a positive Phalen's test. The patient has a positive Tinel's test. The patient has decreased pinprick and light touch sensation in the median nerve distribution. There is normal grip strength and pincer strength. The patient has less than 2 second capillary refill with good skin warmth. Normal radial and ulnar pulse is palpated.   Neurologic: Sensory function is intact, except as noted above. Motor strength is 5/5, except as noted above. No tremor or clonus is present. Good motor coordination is noted.   Heart: Examination of the heart reveals regular, rate, and rhythm. There is no murmur noted on ascultation. There is a normal apical pulse.  Lungs: Lungs are clear to auscultation. There is no wheeze, rhonchi, or crackles. There is normal expansion of bilateral chest walls.   Nerve conduction study results: The nerve conduction study results done on  October 30, 2020 revealed electrodiagnostic evidence of moderately severe bilateral carpal tunnel syndrome with median nerve involvement.  Impression: 1. Carpal tunnel syndrome of right wrist.   Plan:  1. I discussed the physical exam finding as well as his previous treatment and his nerve conduction study results. Dr. Roland Rack evaluate the patient today in the clinic. 2. The patient will be set up for a right wrist carpal tunnel release endoscopically in the near future with Dr. Roland Rack. The patient had the risk, benefits, and alternatives to the surgery reviewed with the patient with Dr. Roland Rack. The patient seems to understand and wants to set up in the near future. 3. The patient will continue the resplint if needed. 4. The patient follow-up after surgery for postoperative care.  This note will serve as the history and physical for surgery scheduled on  November 13, 2022 for a right endoscopic carpal tunnel release to be done by Dr. Roland Rack.    H&P reviewed and patient re-examined. No changes.

## 2022-11-13 NOTE — Anesthesia Procedure Notes (Signed)
Procedure Name: LMA Insertion Date/Time: 11/13/2022 11:42 AM  Performed by: Rakiyah Esch, [place], CRNAPre-anesthesia Checklist: Patient identified, Patient being monitored, Timeout performed, Emergency Drugs available and Suction available Patient Re-evaluated:Patient Re-evaluated prior to induction Oxygen Delivery Method: Circle system utilized Preoxygenation: Pre-oxygenation with 100% oxygen Induction Type: IV induction Ventilation: Mask ventilation without difficulty LMA: LMA inserted LMA Size: 4.0 Tube type: Oral Number of attempts: 1 Placement Confirmation: positive ETCO2 and breath sounds checked- equal and bilateral Tube secured with: Tape Dental Injury: Teeth and Oropharynx as per pre-operative assessment

## 2022-11-14 ENCOUNTER — Encounter: Payer: Self-pay | Admitting: Surgery

## 2023-04-29 ENCOUNTER — Other Ambulatory Visit: Payer: Self-pay | Admitting: Student

## 2023-04-29 DIAGNOSIS — N1831 Chronic kidney disease, stage 3a: Secondary | ICD-10-CM

## 2023-05-08 ENCOUNTER — Ambulatory Visit: Payer: Medicare HMO

## 2023-05-16 ENCOUNTER — Ambulatory Visit
Admission: RE | Admit: 2023-05-16 | Discharge: 2023-05-16 | Disposition: A | Discharge status: Home / Self Care | Payer: Medicare HMO | Source: Ambulatory Visit | Attending: Student | Admitting: Student

## 2023-05-16 DIAGNOSIS — N1831 Chronic kidney disease, stage 3a: Secondary | ICD-10-CM | POA: Diagnosis present
